# Patient Record
Sex: Female | Born: 1950 | Race: Black or African American | Hispanic: No | Marital: Married | Smoking: Never smoker
Health system: Southern US, Community
[De-identification: ages and names within clinical notes are randomized; demographics above are authoritative.]

## PROBLEM LIST (undated history)

## (undated) DIAGNOSIS — Z972 Presence of dental prosthetic device (complete) (partial): Secondary | ICD-10-CM

## (undated) DIAGNOSIS — K589 Irritable bowel syndrome without diarrhea: Secondary | ICD-10-CM

## (undated) DIAGNOSIS — T4145XA Adverse effect of unspecified anesthetic, initial encounter: Secondary | ICD-10-CM

## (undated) DIAGNOSIS — Z923 Personal history of irradiation: Secondary | ICD-10-CM

## (undated) DIAGNOSIS — M419 Scoliosis, unspecified: Secondary | ICD-10-CM

## (undated) DIAGNOSIS — Z9889 Other specified postprocedural states: Secondary | ICD-10-CM

## (undated) DIAGNOSIS — C50919 Malignant neoplasm of unspecified site of unspecified female breast: Secondary | ICD-10-CM

## (undated) DIAGNOSIS — R112 Nausea with vomiting, unspecified: Secondary | ICD-10-CM

## (undated) DIAGNOSIS — T8859XA Other complications of anesthesia, initial encounter: Secondary | ICD-10-CM

## (undated) DIAGNOSIS — G4730 Sleep apnea, unspecified: Secondary | ICD-10-CM

## (undated) DIAGNOSIS — I10 Essential (primary) hypertension: Secondary | ICD-10-CM

## (undated) HISTORY — DX: Irritable bowel syndrome, unspecified: K58.9

## (undated) HISTORY — PX: CATARACT EXTRACTION: SUR2

## (undated) HISTORY — PX: EYE SURGERY: SHX253

## (undated) HISTORY — DX: Essential (primary) hypertension: I10

## (undated) HISTORY — PX: YAG LASER APPLICATION: SHX6189

## (undated) HISTORY — DX: Personal history of irradiation: Z92.3

## (undated) HISTORY — DX: Sleep apnea, unspecified: G47.30

## (undated) HISTORY — PX: COLONOSCOPY: SHX174

## (undated) HISTORY — PX: VULVA /PERINEUM BIOPSY: SHX319

## (undated) HISTORY — PX: HEMORRHOID SURGERY: SHX153

---

## 1989-02-04 HISTORY — PX: CARPAL TUNNEL RELEASE: SHX101

## 1993-02-04 HISTORY — PX: VAGINAL HYSTERECTOMY: SUR661

## 1997-06-17 ENCOUNTER — Other Ambulatory Visit: Admission: RE | Admit: 1997-06-17 | Discharge: 1997-06-17 | Payer: Self-pay | Admitting: Obstetrics and Gynecology

## 1999-01-03 ENCOUNTER — Other Ambulatory Visit: Admission: RE | Admit: 1999-01-03 | Discharge: 1999-01-03 | Payer: Self-pay | Admitting: Obstetrics and Gynecology

## 2000-01-03 ENCOUNTER — Other Ambulatory Visit: Admission: RE | Admit: 2000-01-03 | Discharge: 2000-01-03 | Payer: Self-pay | Admitting: Obstetrics and Gynecology

## 2001-01-19 ENCOUNTER — Other Ambulatory Visit: Admission: RE | Admit: 2001-01-19 | Discharge: 2001-01-19 | Payer: Self-pay | Admitting: Obstetrics and Gynecology

## 2002-01-20 ENCOUNTER — Other Ambulatory Visit: Admission: RE | Admit: 2002-01-20 | Discharge: 2002-01-20 | Payer: Self-pay | Admitting: Obstetrics and Gynecology

## 2002-12-09 ENCOUNTER — Other Ambulatory Visit: Admission: RE | Admit: 2002-12-09 | Discharge: 2002-12-09 | Payer: Self-pay | Admitting: Obstetrics and Gynecology

## 2003-08-18 ENCOUNTER — Encounter: Admission: RE | Admit: 2003-08-18 | Discharge: 2003-08-18 | Payer: Self-pay | Admitting: Family Medicine

## 2003-10-11 ENCOUNTER — Encounter (INDEPENDENT_AMBULATORY_CARE_PROVIDER_SITE_OTHER): Payer: Self-pay | Admitting: *Deleted

## 2003-10-11 ENCOUNTER — Ambulatory Visit (HOSPITAL_COMMUNITY): Admission: RE | Admit: 2003-10-11 | Discharge: 2003-10-11 | Payer: Self-pay | Admitting: Internal Medicine

## 2003-10-11 HISTORY — PX: ESOPHAGOGASTRODUODENOSCOPY: SHX1529

## 2003-12-07 ENCOUNTER — Ambulatory Visit: Payer: Self-pay | Admitting: Internal Medicine

## 2003-12-15 ENCOUNTER — Other Ambulatory Visit: Admission: RE | Admit: 2003-12-15 | Discharge: 2003-12-15 | Payer: Self-pay | Admitting: Obstetrics and Gynecology

## 2003-12-19 ENCOUNTER — Encounter: Admission: RE | Admit: 2003-12-19 | Discharge: 2003-12-19 | Payer: Self-pay | Admitting: Internal Medicine

## 2004-02-01 ENCOUNTER — Ambulatory Visit: Payer: Self-pay | Admitting: Internal Medicine

## 2004-02-02 ENCOUNTER — Ambulatory Visit: Payer: Self-pay | Admitting: Internal Medicine

## 2004-05-14 ENCOUNTER — Encounter (INDEPENDENT_AMBULATORY_CARE_PROVIDER_SITE_OTHER): Payer: Self-pay | Admitting: *Deleted

## 2004-05-14 LAB — CONVERTED CEMR LAB

## 2004-11-02 ENCOUNTER — Ambulatory Visit: Payer: Self-pay | Admitting: Internal Medicine

## 2004-11-30 ENCOUNTER — Ambulatory Visit: Payer: Self-pay | Admitting: Internal Medicine

## 2004-12-04 ENCOUNTER — Ambulatory Visit: Payer: Self-pay | Admitting: Internal Medicine

## 2004-12-17 ENCOUNTER — Ambulatory Visit: Payer: Self-pay | Admitting: Internal Medicine

## 2005-01-14 ENCOUNTER — Other Ambulatory Visit: Admission: RE | Admit: 2005-01-14 | Discharge: 2005-01-14 | Payer: Self-pay | Admitting: Obstetrics and Gynecology

## 2005-06-20 ENCOUNTER — Ambulatory Visit: Payer: Self-pay | Admitting: Family Medicine

## 2005-07-30 ENCOUNTER — Ambulatory Visit: Payer: Self-pay | Admitting: Internal Medicine

## 2005-12-30 ENCOUNTER — Ambulatory Visit: Payer: Self-pay | Admitting: Internal Medicine

## 2005-12-30 LAB — CONVERTED CEMR LAB
ALT: 31 units/L (ref 0–40)
AST: 27 units/L (ref 0–37)
BUN: 17 mg/dL (ref 6–23)
Chol/HDL Ratio, serum: 2.7
Cholesterol: 140 mg/dL (ref 0–200)
Creatinine, Ser: 0.9 mg/dL (ref 0.4–1.2)
Creatinine,U: 54.2 mg/dL
HDL: 51.9 mg/dL (ref >39.0)
Hgb A1c MFr Bld: 5 % (ref 4.6–6.0)
LDL Cholesterol: 77 mg/dL (ref 0–99)
Microalb, Ur: 0.2 mg/dL (ref 0.0–1.9)
Potassium: 3.6 meq/L (ref 3.5–5.1)
Triglyceride fasting, serum: 58 mg/dL (ref 0–149)
VLDL: 12 mg/dL (ref 0–40)

## 2006-03-22 IMAGING — US US ABDOMEN COMPLETE
1 series · 14 of 25 positions shown · non-contrast
Comparison: none

CLINICAL DATA: Abdominal pain.
 ULTRASOUND OF THE ABDOMEN COMPLETE
 Scans over the upper abdomen were performed.  The gallbladder is well seen and no gallstones are noted.  The liver has a normal echogenic pattern.  No ductal dilatation is seen.  The common bile duct is normal measuring 3.1 mm in diameter.  Evaluation of the IVC is limited by bowel gas.  The pancreas is somewhat obscured by bowel gas as well particularly in the tail.  No mass or ductal dilatation is seen.  The spleen is normal in size.  No hydronephrosis is noted.  The right kidney measures 10.0 cm sagittally with the left kidney measuring 10.5 cm.  The abdominal aorta is normal in caliber. 
 IMPRESSION
 1.  No gallstones.  
 2.  Bowel gas obscures portions of the anatomy particularly the pancreas.

[Series 1: unknown · 14 of 75 slices shown]
[im 1/75]
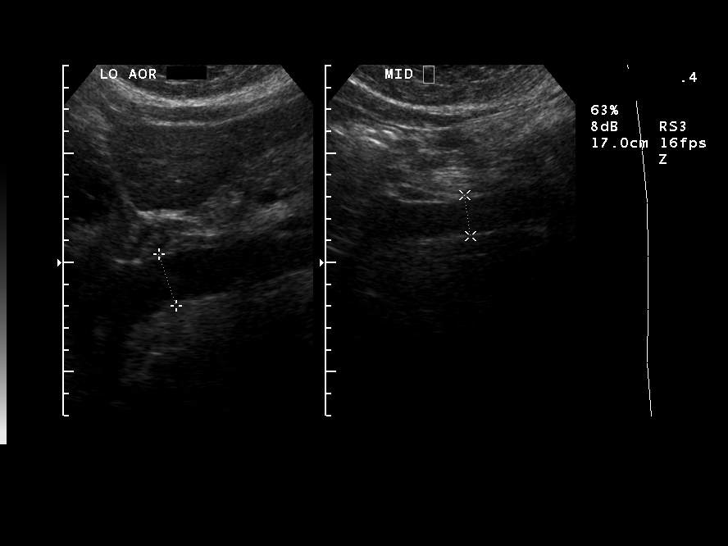
[im 7/75]
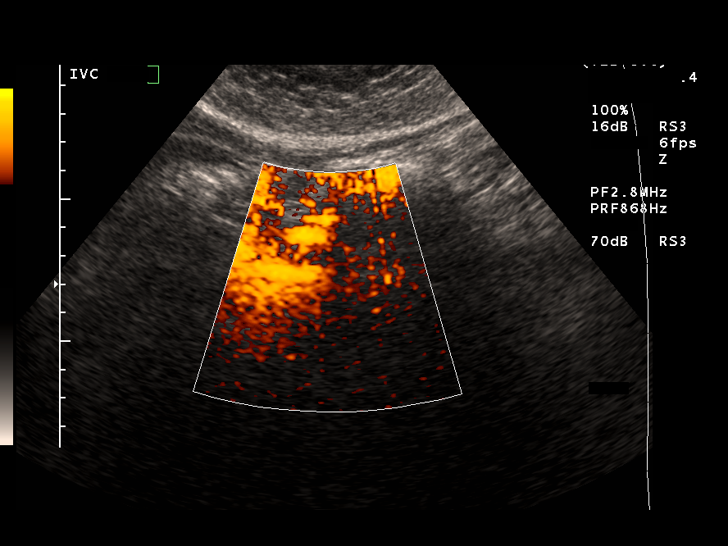
[im 13/75]
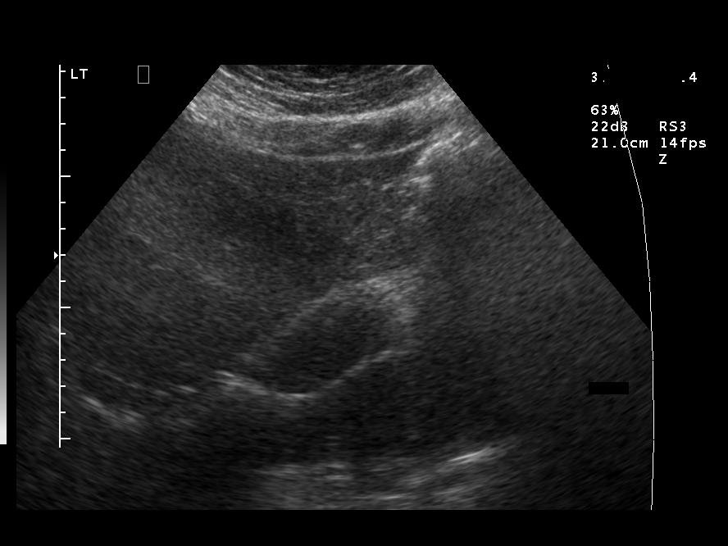
[im 19/75]
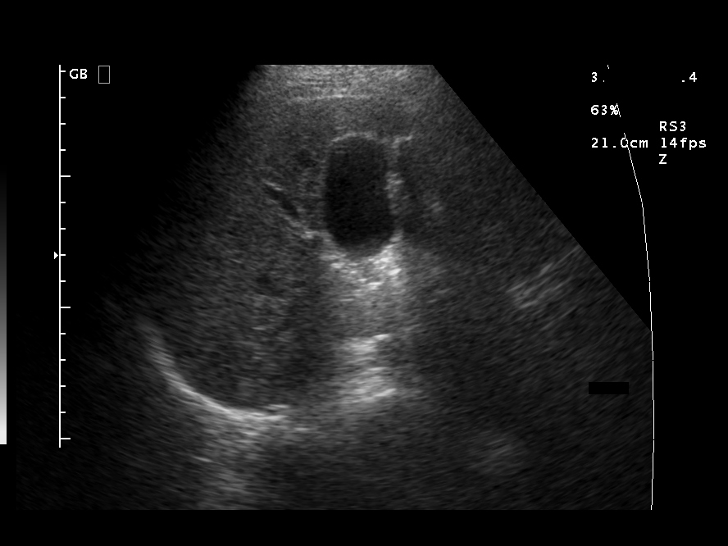
[im 25/75]
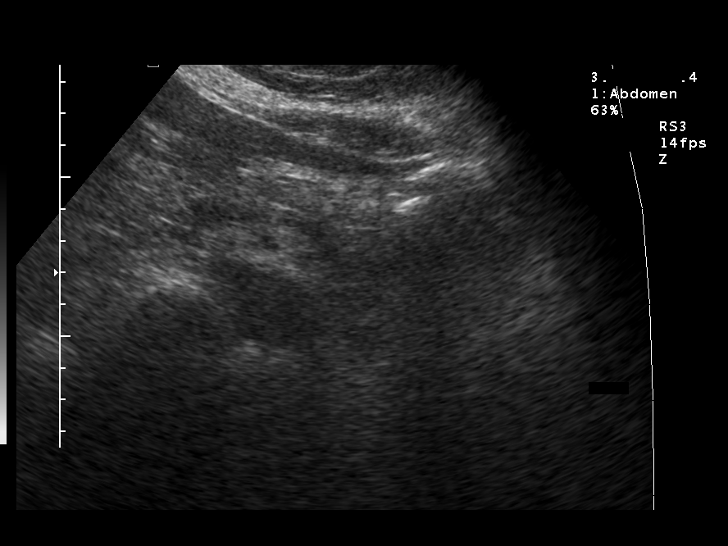
[im 28/75]
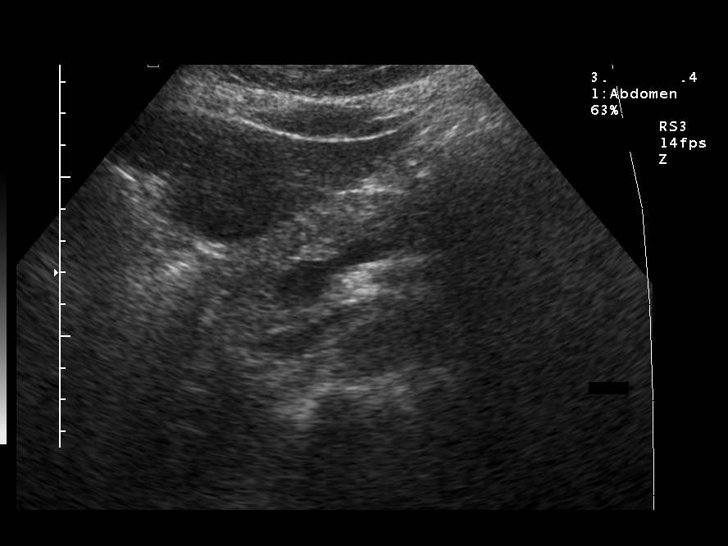
[im 34/75]
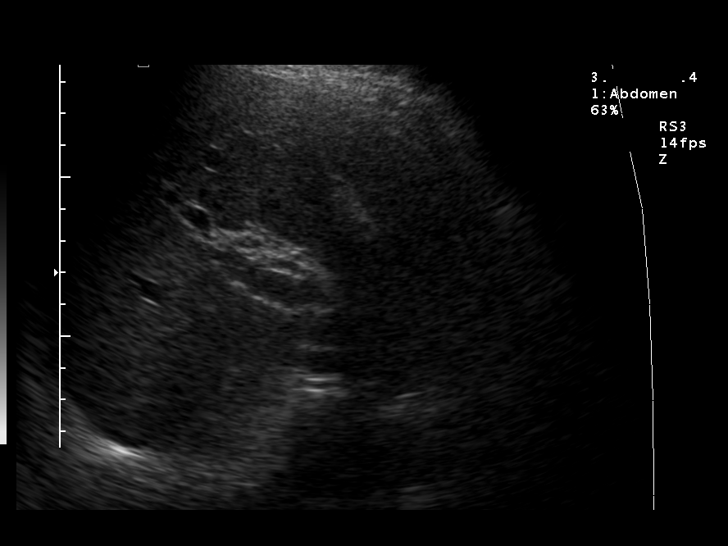
[im 41/75]
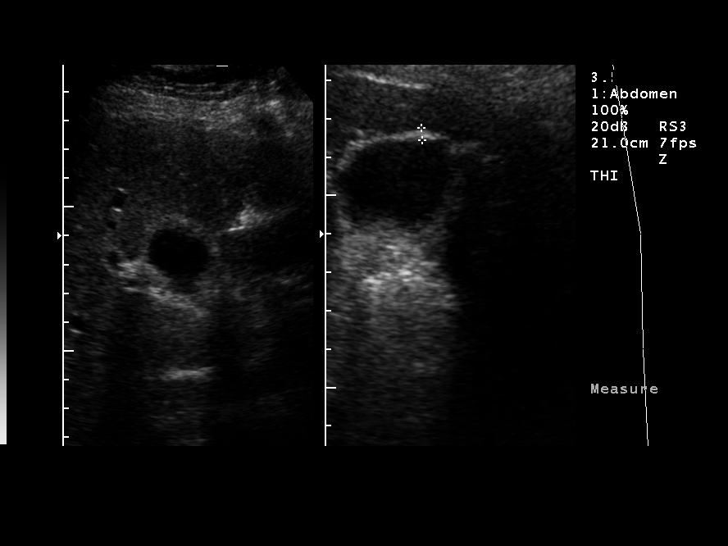
[im 47/75]
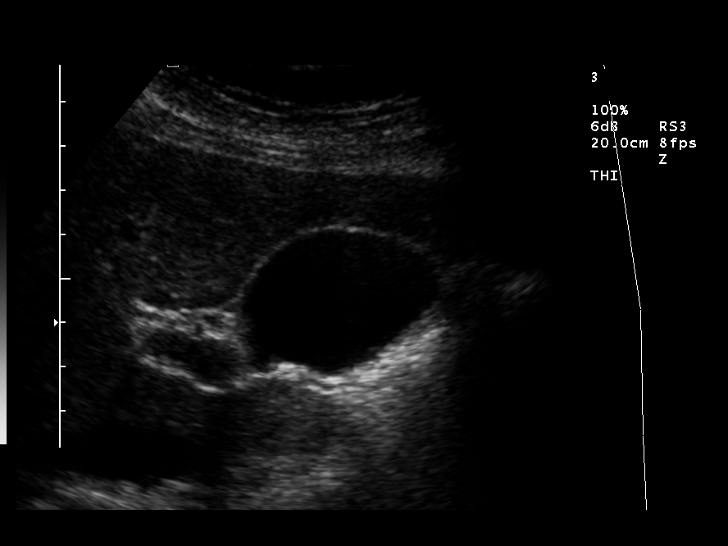
[im 50/75]
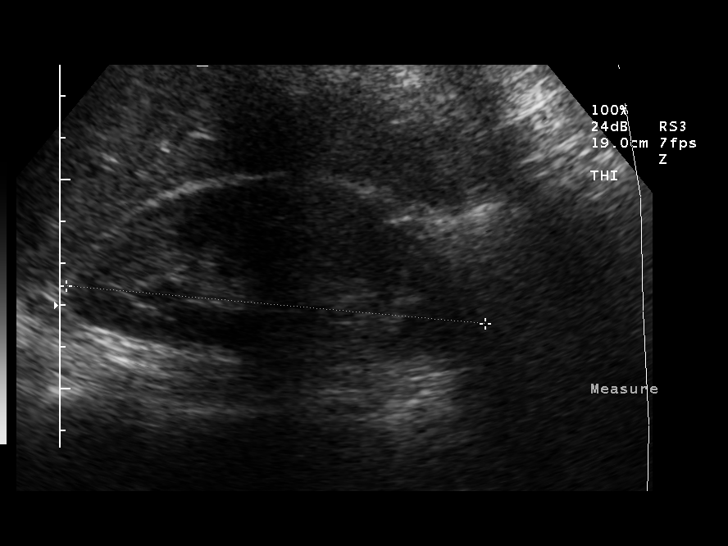
[im 56/75]
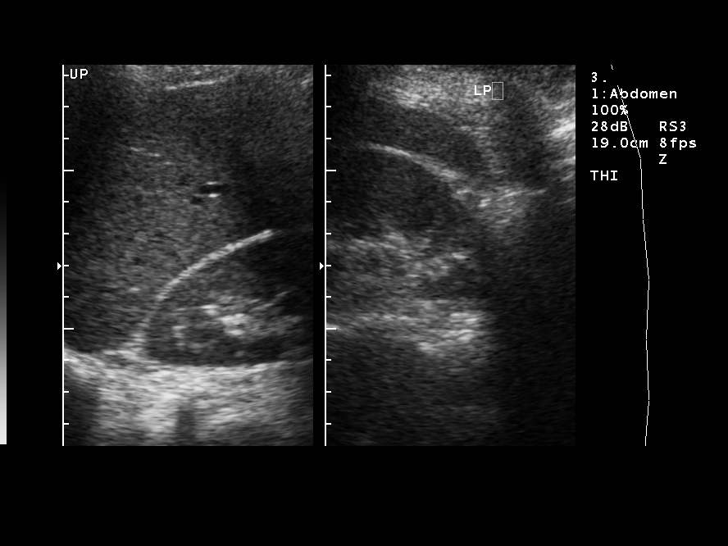
[im 62/75]
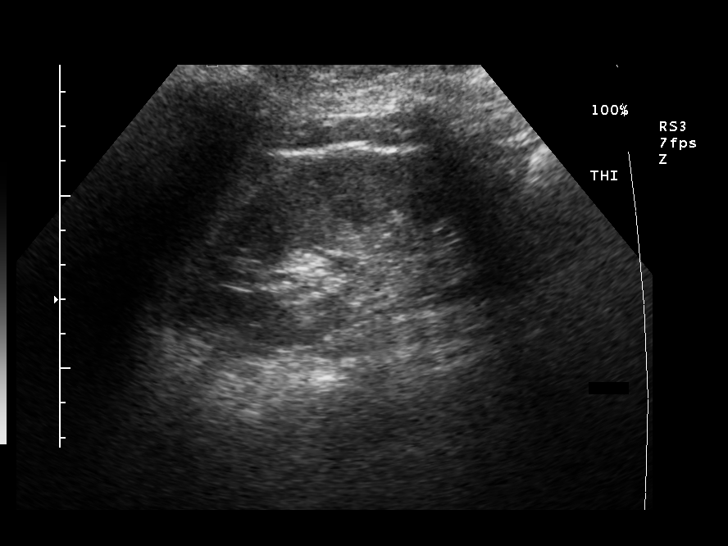
[im 68/75]
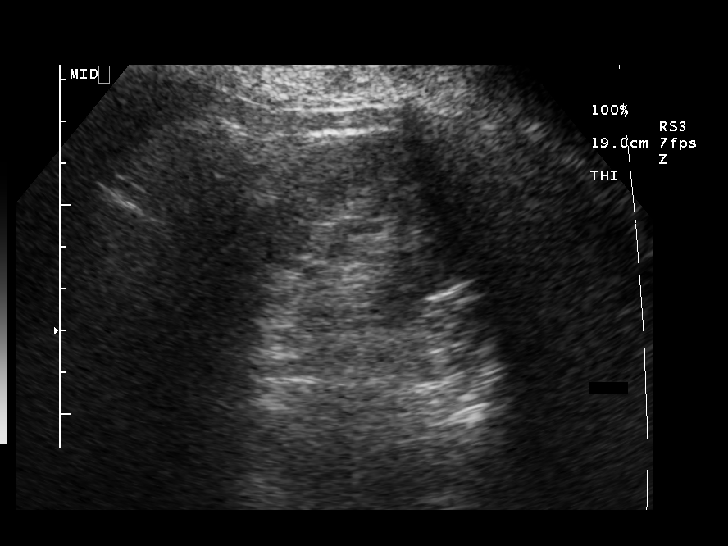
[im 75/75]
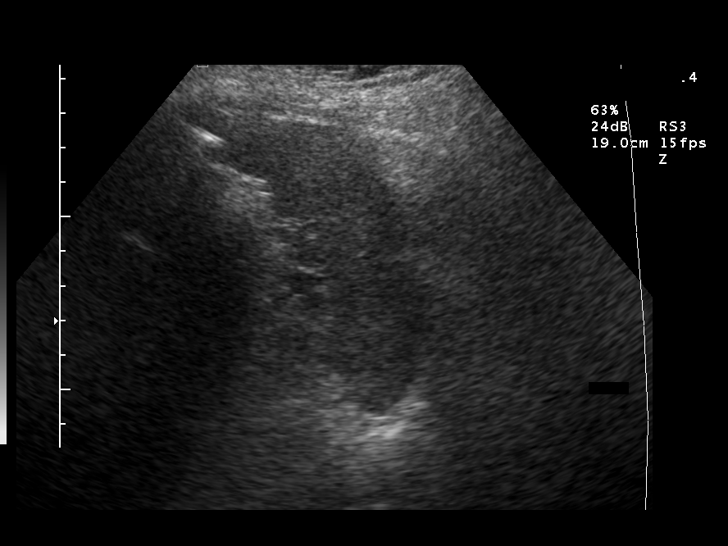

[14 of 25 positions shown; findings below may reference images not displayed]

## 2006-06-04 ENCOUNTER — Ambulatory Visit: Payer: Self-pay | Admitting: Internal Medicine

## 2006-06-23 ENCOUNTER — Encounter: Payer: Self-pay | Admitting: Internal Medicine

## 2006-09-02 ENCOUNTER — Encounter: Payer: Self-pay | Admitting: Family Medicine

## 2007-03-30 ENCOUNTER — Encounter (INDEPENDENT_AMBULATORY_CARE_PROVIDER_SITE_OTHER): Payer: Self-pay | Admitting: *Deleted

## 2007-03-30 DIAGNOSIS — I10 Essential (primary) hypertension: Secondary | ICD-10-CM | POA: Insufficient documentation

## 2007-03-30 DIAGNOSIS — G4730 Sleep apnea, unspecified: Secondary | ICD-10-CM | POA: Insufficient documentation

## 2007-03-30 DIAGNOSIS — R7611 Nonspecific reaction to tuberculin skin test without active tuberculosis: Secondary | ICD-10-CM | POA: Insufficient documentation

## 2007-03-30 DIAGNOSIS — Z91018 Allergy to other foods: Secondary | ICD-10-CM | POA: Insufficient documentation

## 2007-03-30 DIAGNOSIS — D573 Sickle-cell trait: Secondary | ICD-10-CM | POA: Insufficient documentation

## 2007-03-30 DIAGNOSIS — R9431 Abnormal electrocardiogram [ECG] [EKG]: Secondary | ICD-10-CM | POA: Insufficient documentation

## 2007-03-30 DIAGNOSIS — K589 Irritable bowel syndrome without diarrhea: Secondary | ICD-10-CM | POA: Insufficient documentation

## 2007-03-30 DIAGNOSIS — K219 Gastro-esophageal reflux disease without esophagitis: Secondary | ICD-10-CM | POA: Insufficient documentation

## 2007-05-18 ENCOUNTER — Telehealth (INDEPENDENT_AMBULATORY_CARE_PROVIDER_SITE_OTHER): Payer: Self-pay | Admitting: *Deleted

## 2007-06-11 ENCOUNTER — Encounter (INDEPENDENT_AMBULATORY_CARE_PROVIDER_SITE_OTHER): Payer: Self-pay | Admitting: *Deleted

## 2007-06-11 ENCOUNTER — Encounter: Payer: Self-pay | Admitting: Internal Medicine

## 2007-06-25 ENCOUNTER — Encounter: Payer: Self-pay | Admitting: Internal Medicine

## 2007-07-20 ENCOUNTER — Encounter: Payer: Self-pay | Admitting: Internal Medicine

## 2007-11-02 ENCOUNTER — Ambulatory Visit: Payer: Self-pay | Admitting: Internal Medicine

## 2007-11-02 DIAGNOSIS — E785 Hyperlipidemia, unspecified: Secondary | ICD-10-CM | POA: Insufficient documentation

## 2007-11-02 DIAGNOSIS — D649 Anemia, unspecified: Secondary | ICD-10-CM | POA: Insufficient documentation

## 2007-11-03 ENCOUNTER — Encounter (INDEPENDENT_AMBULATORY_CARE_PROVIDER_SITE_OTHER): Payer: Self-pay | Admitting: *Deleted

## 2007-11-11 ENCOUNTER — Ambulatory Visit: Payer: Self-pay | Admitting: Internal Medicine

## 2007-11-11 LAB — CONVERTED CEMR LAB
OCCULT 1: NEGATIVE
OCCULT 2: NEGATIVE
OCCULT 3: NEGATIVE

## 2007-11-12 ENCOUNTER — Encounter (INDEPENDENT_AMBULATORY_CARE_PROVIDER_SITE_OTHER): Payer: Self-pay | Admitting: *Deleted

## 2008-06-20 ENCOUNTER — Encounter: Payer: Self-pay | Admitting: Internal Medicine

## 2009-04-10 ENCOUNTER — Ambulatory Visit: Payer: Self-pay | Admitting: Internal Medicine

## 2009-07-31 ENCOUNTER — Encounter: Payer: Self-pay | Admitting: Internal Medicine

## 2010-03-04 LAB — CONVERTED CEMR LAB
ALT: 16 units/L (ref 0–35)
ALT: 18 units/L (ref 0–35)
AST: 20 units/L (ref 0–37)
AST: 21 units/L (ref 0–37)
Albumin: 3.9 g/dL (ref 3.5–5.2)
Albumin: 4 g/dL (ref 3.5–5.2)
Alkaline Phosphatase: 76 units/L (ref 39–117)
Alkaline Phosphatase: 84 units/L (ref 39–117)
BUN: 12 mg/dL (ref 6–23)
BUN: 8 mg/dL (ref 6–23)
Basophils Absolute: 0 10*3/uL (ref 0.0–0.1)
Basophils Absolute: 0 10*3/uL (ref 0.0–0.1)
Basophils Relative: 0.6 % (ref 0.0–3.0)
Basophils Relative: 0.6 % (ref 0.0–3.0)
Bilirubin Urine: NEGATIVE
Bilirubin, Direct: 0.1 mg/dL (ref 0.0–0.3)
Bilirubin, Direct: 0.1 mg/dL (ref 0.0–0.3)
Blood in Urine, dipstick: NEGATIVE
CO2: 31 meq/L (ref 19–32)
CO2: 32 meq/L (ref 19–32)
Calcium: 9.3 mg/dL (ref 8.4–10.5)
Calcium: 9.4 mg/dL (ref 8.4–10.5)
Chloride: 108 meq/L (ref 96–112)
Chloride: 110 meq/L (ref 96–112)
Cholesterol: 124 mg/dL (ref 0–200)
Cholesterol: 139 mg/dL (ref 0–200)
Creatinine, Ser: 0.9 mg/dL (ref 0.4–1.2)
Creatinine, Ser: 1 mg/dL (ref 0.4–1.2)
Eosinophils Absolute: 0 10*3/uL (ref 0.0–0.7)
Eosinophils Absolute: 0.1 10*3/uL (ref 0.0–0.7)
Eosinophils Relative: 0.9 % (ref 0.0–5.0)
Eosinophils Relative: 1.6 % (ref 0.0–5.0)
Folate: 17.3 ng/mL
GFR calc Af Amer: 83 mL/min
GFR calc non Af Amer: 69 mL/min
GFR calc non Af Amer: 73.17 mL/min (ref >60)
Glucose, Bld: 81 mg/dL (ref 70–99)
Glucose, Bld: 87 mg/dL (ref 70–99)
Glucose, Urine, Semiquant: NEGATIVE
HCT: 39.3 % (ref 36.0–46.0)
HCT: 40.6 % (ref 36.0–46.0)
HDL: 52.1 mg/dL (ref >39.00)
HDL: 53.1 mg/dL (ref >39.0)
Hemoglobin: 12.7 g/dL (ref 12.0–15.0)
Hemoglobin: 13.4 g/dL (ref 12.0–15.0)
Iron: 71 ug/dL (ref 42–145)
Ketones, urine, test strip: NEGATIVE
LDL Cholesterol: 51 mg/dL (ref 0–99)
LDL Cholesterol: 71 mg/dL (ref 0–99)
Lymphocytes Relative: 43.2 % (ref 12.0–46.0)
Lymphocytes Relative: 46 % (ref 12.0–46.0)
Lymphs Abs: 1.6 10*3/uL (ref 0.7–4.0)
MCHC: 32.2 g/dL (ref 30.0–36.0)
MCHC: 33.1 g/dL (ref 30.0–36.0)
MCV: 90.2 fL (ref 78.0–100.0)
MCV: 91 fL (ref 78.0–100.0)
Monocytes Absolute: 0.3 10*3/uL (ref 0.1–1.0)
Monocytes Absolute: 0.3 10*3/uL (ref 0.1–1.0)
Monocytes Relative: 7.3 % (ref 3.0–12.0)
Monocytes Relative: 8.4 % (ref 3.0–12.0)
Neutro Abs: 1.6 10*3/uL (ref 1.4–7.7)
Neutro Abs: 1.7 10*3/uL (ref 1.4–7.7)
Neutrophils Relative %: 45.2 % (ref 43.0–77.0)
Neutrophils Relative %: 46.2 % (ref 43.0–77.0)
Nitrite: NEGATIVE
Platelets: 186 10*3/uL (ref 150.0–400.0)
Platelets: 189 10*3/uL (ref 150–400)
Potassium: 3.5 meq/L (ref 3.5–5.1)
Potassium: 3.7 meq/L (ref 3.5–5.1)
Protein, U semiquant: NEGATIVE
RBC: 4.32 M/uL (ref 3.87–5.11)
RBC: 4.5 M/uL (ref 3.87–5.11)
RDW: 12.9 % (ref 11.5–14.6)
RDW: 13.1 % (ref 11.5–14.6)
Saturation Ratios: 22.1 % (ref 20.0–50.0)
Sodium: 142 meq/L (ref 135–145)
Sodium: 143 meq/L (ref 135–145)
Specific Gravity, Urine: <1.005
TSH: 0.96 microintl units/mL (ref 0.35–5.50)
TSH: 1.35 microintl units/mL (ref 0.35–5.50)
Total Bilirubin: 0.5 mg/dL (ref 0.3–1.2)
Total Bilirubin: 0.8 mg/dL (ref 0.3–1.2)
Total CHOL/HDL Ratio: 2
Total CHOL/HDL Ratio: 2.6
Total Protein: 7.6 g/dL (ref 6.0–8.3)
Total Protein: 7.7 g/dL (ref 6.0–8.3)
Transferrin: 229.8 mg/dL (ref >=212.0)
Triglycerides: 107 mg/dL (ref 0.0–149.0)
Triglycerides: 74 mg/dL (ref 0–149)
Urobilinogen, UA: NEGATIVE
VLDL: 15 mg/dL (ref 0–40)
VLDL: 21.4 mg/dL (ref 0.0–40.0)
Vitamin B-12: 744 pg/mL (ref 211–911)
WBC Urine, dipstick: NEGATIVE
WBC: 3.6 10*3/uL — ABNORMAL LOW (ref 4.5–10.5)
WBC: 3.8 10*3/uL — ABNORMAL LOW (ref 4.5–10.5)
pH: 6.5

## 2010-03-08 NOTE — Assessment & Plan Note (Signed)
Summary: cpx/ns/kdc   Vital Signs:  Patient profile:   60 year old female Height:      61.75 inches Weight:      176.8 pounds BMI:     32.72 Temp:     98.7 degrees F oral Pulse rate:   70 / minute Resp:     16 per minute BP sitting:   118 / 86  (left arm) Cuff size:   large  Vitals Entered By: Shonna Chock (April 10, 2009 8:35 AM)  Comments REVIEWED MED LIST, PATIENT AGREED DOSE AND INSTRUCTION CORRECT    History of Present Illness: Cynthia Wagner  is here for a physical; she is asymptomatic except for intermittent IBS symptoms.  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 (11-29-02) for atypical headaches; TMJ Dysfunction resolved with Prosthesis; EGD - reflux (10-11-03) GB U/S - NL. (08-18-03) Biliary scan- neg. (07-25-03) PAD screening - neg. (11-24-03) CT abdomen/pelvis - nl. (12-19-03) Participation in IBS [place] Study, PMH of Vulvar biopsy X 2: Lichen Simplex;Verruca, Dr Vincente Poli Hemorrhoidectomy by Infrared Coagulation  Family History: Father: MI , massive died @ 52,smoker Mother: CVA @ 9 (died of complications, in [place] 20 yrs.) Siblings: sister- Breast CA (died 99), PTE                brother- HTN crisis Maternal uncle:  TB Maternal aunts:  DM  Social History: Never Smoked Alcohol use-yes: socially Occupation: Garment/textile technologist Regular exercise-yes: Glider 30 min 3X/week Married  Review of Systems  The patient denies anorexia, fever, weight loss, weight gain,  vision loss, decreased hearing, hoarseness, chest pain, syncope, dyspnea on exertion, peripheral edema, prolonged cough, headaches, hemoptysis, hematuria, incontinence, suspicious skin lesions, depression, unusual weight change, abnormal bleeding, enlarged lymph nodes, and angioedema.         BMD normal 2010 ; mammograms done annually. Dr Vincente Poli seen every 3 mos for HRT management;now ? peri menopausal GI:  Complains of constipation; denies abdominal pain, bloody stools, dark tarry stools, diarrhea, excessive appetite, gas, indigestion, loss of appetite, nausea, and vomiting; Working in OR affects bathroom access.  Physical Exam  General:  well-nourished; alert,appropriate and cooperative throughout examination Head:  Normocephalic and atraumatic without obvious abnormalities.  Eyes:  No corneal or conjunctival inflammation noted. Marland Kitchen Perrla. Funduscopic exam benign, without hemorrhages, exudates or papilledema. Minimal arteriolar narrowing Ears:  External ear exam shows no significant lesions or deformities.  Otoscopic examination reveals clear canals, tympanic membranes are intact bilaterally without bulging, retraction, inflammation or discharge. Hearing is grossly normal bilaterally. Nose:  External nasal examination shows no deformity or inflammation. Nasal mucosa are pink and moist without lesions or exudates. Mouth:  Oral mucosa and oropharynx without lesions or exudates.  Teeth in good repair. Neck:  No deformities, masses, or tenderness noted. Lungs:  Normal respiratory effort, chest expands symmetrically. Lungs are clear to auscultation, no crackles or wheezes. Heart:  Normal rate and regular rhythm. S1 and S2 normal without gallop, murmur, click, rub. S4 Abdomen:  Bowel sounds positive,abdomen soft and non-tender without masses, organomegaly or  hernias noted. Genitalia:  Dr Vincente Poli Msk:  Asymmetry of posterior thoracic musculature Pulses:  R and L carotid,radial,dorsalis pedis and  posterior tibial pulses are full and equal bilaterally Extremities:  No clubbing, cyanosis, edema, or deformity noted with normal full range of motion of all joints.  Pes planus. Mild crepitus of knees  Neurologic:  alert & oriented X3 and DTRs symmetrical and normal.   Skin:  Intact without suspicious lesions or rashes Cervical Nodes:  No lymphadenopathy noted Axillary Nodes:  No palpable lymphadenopathy Psych:  memory intact for recent and remote, normally interactive, and good eye contact.     Impression & Recommendations:  Problem # 1:  ROUTINE GENERAL MEDICAL EXAM@[email or web address]  CARE FACL (ICD-V70.0)  Orders: EKG w/ Interpretation (93000) Venipuncture (21308) TLB-Lipid Panel (80061-LIPID) TLB-BMP (Basic Metabolic Panel-BMET) (80048-METABOL) TLB-CBC Platelet - w/Differential (85025-CBCD) TLB-Hepatic/Liver Function Pnl (80076-HEPATIC) TLB-TSH (Thyroid Stimulating Hormone) (84443-TSH)  Problem # 2:  HYPERLIPIDEMIA (ICD-272.4)  Her updated medication list for this problem includes:    Vytorin 10-20 Mg Tabs (Ezetimibe-simvastatin) .Marland Kitchen... Take one tablet at bedtime  Orders: Venipuncture (65784) TLB-Lipid Panel (80061-LIPID)  Problem # 3:  Hx of ABNORMAL ELECTROCARDIOGRAM (ICD-794.31) Stable NS ST-T changes  Problem # 4:  SLEEP APNEA (ICD-780.57) CPAP controlled  Problem # 5:  IBS (ICD-564.1)  Problem # 6:  HYPERTENSION (ICD-401.9)  Her updated medication list for this problem includes:    Diovan Hct 80-12.5 Mg Tabs (Valsartan-hydrochlorothiazide) .Marland Kitchen... Take as directed    Metoprolol Tartrate 50 Mg Tabs (Metoprolol tartrate) .Marland Kitchen... As directed  Orders: EKG w/ Interpretation (93000) Venipuncture (69629)  Complete Medication List: 1)  Diovan Hct 80-12.5 Mg Tabs (Valsartan-hydrochlorothiazide) .... Take as directed 2)  Vytorin 10-20 Mg Tabs (Ezetimibe-simvastatin) .... Take one tablet at bedtime 3)  Prilosec Otc 20 Mg Tbec (Omeprazole magnesium) .... As needed 4)  Metoprolol  Tartrate 50 Mg Tabs (Metoprolol tartrate) .... As directed 5)  Multivitamins Tabs (Multiple vitamin) .... Take 1 tablet by mouth once a day 6)  Fish Oil Concentrate 1000 Mg Caps (Omega-3 fatty acids) 7)  Colace 100 Mg Caps (Docusate sodium) .... As needed 8)  Restasis 0.05 % Emul (Cyclosporine) .... Two times a day 9)  Cpap  10)  Femring 0.1 Mg/24hr Ring (Estradiol acetate) .... As directed 11)  Align Caps (Probiotic product) .Marland Kitchen.. 1 by mouth once daily as needed 12)  Multivitamins Tabs (Multiple vitamin) .Marland Kitchen.. 1 by mouth once daily  Patient Instructions: 1)  Check your Blood Pressure regularly. If it is above:135/85 ON AVERAGE  you should make an appointment. Your EKG changes are NON SPECIFIC & STABLE

## 2010-04-23 ENCOUNTER — Encounter: Payer: Self-pay | Admitting: Internal Medicine

## 2010-04-23 ENCOUNTER — Encounter (INDEPENDENT_AMBULATORY_CARE_PROVIDER_SITE_OTHER): Payer: BC Managed Care – PPO | Admitting: Internal Medicine

## 2010-04-23 ENCOUNTER — Other Ambulatory Visit: Payer: Self-pay | Admitting: Internal Medicine

## 2010-04-23 DIAGNOSIS — E559 Vitamin D deficiency, unspecified: Secondary | ICD-10-CM

## 2010-04-23 DIAGNOSIS — Z0000 Encounter for general adult medical examination without abnormal findings: Secondary | ICD-10-CM

## 2010-04-23 DIAGNOSIS — Z8249 Family history of ischemic heart disease and other diseases of the circulatory system: Secondary | ICD-10-CM

## 2010-04-23 DIAGNOSIS — I10 Essential (primary) hypertension: Secondary | ICD-10-CM

## 2010-04-23 DIAGNOSIS — E785 Hyperlipidemia, unspecified: Secondary | ICD-10-CM

## 2010-04-23 DIAGNOSIS — R9431 Abnormal electrocardiogram [ECG] [EKG]: Secondary | ICD-10-CM

## 2010-04-23 LAB — CBC WITH DIFFERENTIAL/PLATELET
Basophils Absolute: 0 10*3/uL (ref 0.0–0.1)
Basophils Relative: 0.5 % (ref 0.0–3.0)
Eosinophils Absolute: 0.1 10*3/uL (ref 0.0–0.7)
Eosinophils Relative: 3.3 % (ref 0.0–5.0)
HCT: 37.9 % (ref 36.0–46.0)
Hemoglobin: 12.7 g/dL (ref 12.0–15.0)
Lymphocytes Relative: 30.8 % (ref 12.0–46.0)
Lymphs Abs: 1.3 10*3/uL (ref 0.7–4.0)
MCHC: 33.6 g/dL (ref 30.0–36.0)
MCV: 88.2 fl (ref 78.0–100.0)
Monocytes Absolute: 0.3 10*3/uL (ref 0.1–1.0)
Monocytes Relative: 7.4 % (ref 3.0–12.0)
Neutro Abs: 2.4 10*3/uL (ref 1.4–7.7)
Neutrophils Relative %: 58 % (ref 43.0–77.0)
Platelets: 187 10*3/uL (ref 150.0–400.0)
RBC: 4.3 Mil/uL (ref 3.87–5.11)
RDW: 14.1 % (ref 11.5–14.6)
WBC: 4.2 10*3/uL — ABNORMAL LOW (ref 4.5–10.5)

## 2010-04-23 LAB — CONVERTED CEMR LAB
Cholesterol, target level: 200 mg/dL
HDL goal, serum: 40 mg/dL
LDL Goal: 130 mg/dL

## 2010-04-23 LAB — HEPATIC FUNCTION PANEL
ALT: 19 U/L (ref 0–35)
AST: 20 U/L (ref 0–37)
Albumin: 4 g/dL (ref 3.5–5.2)
Alkaline Phosphatase: 86 U/L (ref 39–117)
Bilirubin, Direct: 0.1 mg/dL (ref 0.0–0.3)
Total Bilirubin: 0.9 mg/dL (ref 0.3–1.2)
Total Protein: 6.5 g/dL (ref 6.0–8.3)

## 2010-04-23 LAB — BASIC METABOLIC PANEL
BUN: 15 mg/dL (ref 6–23)
CO2: 28 mEq/L (ref 19–32)
Calcium: 9.3 mg/dL (ref 8.4–10.5)
Chloride: 103 mEq/L (ref 96–112)
Creatinine, Ser: 0.8 mg/dL (ref 0.4–1.2)
GFR: 91.68 mL/min (ref >60.00)
Glucose, Bld: 93 mg/dL (ref 70–99)
Potassium: 3.8 mEq/L (ref 3.5–5.1)
Sodium: 139 mEq/L (ref 135–145)

## 2010-04-23 LAB — TSH: TSH: 1.33 u[IU]/mL (ref 0.35–5.50)

## 2010-04-24 LAB — CONVERTED CEMR LAB: Vit D, 25-Hydroxy: 50 ng/mL (ref 30–89)

## 2010-04-27 ENCOUNTER — Encounter: Payer: Self-pay | Admitting: Internal Medicine

## 2010-04-30 ENCOUNTER — Encounter: Payer: Self-pay | Admitting: Family Medicine

## 2010-05-01 ENCOUNTER — Encounter: Payer: Self-pay | Admitting: Family Medicine

## 2010-05-03 NOTE — Assessment & Plan Note (Signed)
Summary: cpx//cbs/ph   Vital Signs:  Patient profile:   60 year old female Height:      61.75 inches Weight:      174.8 pounds BMI:     32.35 Temp:     98.2 degrees F oral Pulse rate:   64 / minute Resp:     14 per minute BP sitting:   118 / 80  (left arm) Cuff size:   large  Vitals Entered By: Shonna Chock CMA (April 23, 2010 8:14 AM) CC: CPX with fasting labs , Lipid Management   CC:  CPX with fasting labs  and Lipid Management.  History of Present Illness:    Cynthia Wagner is here for a physical; she is asymptomatic.    Hyperlipidemia Follow-Up:She reports constipation  (IBS)and fatigue (works 10 hr shifts), but denies muscle aches, GI upset, abdominal pain, flushing, itching,  diarrhea,chest pain/pressure, exercise intolerance, dypsnea, palpitations, syncope, and pedal edema.  Compliance with medications (by patient report) has been near 100%.  Dietary compliance has been good.  The patient reports exercising 3 X per week.  Adjunctive measures currently used by the patient include fiber, flax seed  oil supplements, and Co-Q 10.   Hypertension Follow-Up: She denies lightheadedness, urinary frequency, and headaches.  Compliance with medications (by patient report) has been near 100%.  Adjunctive measures currently used by the patient include salt restriction.  BP averages 128/68.  Lipid Management History:      Positive NCEP/ATP III risk factors include female age 67 years old or older, family history for ischemic heart disease (males less than 28 years old), and hypertension.  Negative NCEP/ATP III risk factors include no history of early menopause without estrogen hormone replacement, non-diabetic, non-tobacco-user status, no ASHD (atherosclerotic heart disease), no prior stroke/TIA, no peripheral vascular disease, and no history of aortic aneurysm.     Current Medications (verified): 1)  Diovan Hct 80-12.5 Mg  Tabs (Valsartan-Hydrochlorothiazide) .... Take As Directed 2)  Vytorin  10-20 Mg  Tabs (Ezetimibe-Simvastatin) .... 1/2 At Bedtime (Ldl Low 50s) 3)  Metoprolol Tartrate 50 Mg  Tabs (Metoprolol Tartrate) .... As Directed 4)  Multivitamins   Tabs (Multiple Vitamin) .... Take 1 Tablet By Mouth Once A Day 5)  Fish Oil Concentrate 1000 Mg  Caps (Omega-3 Fatty Acids) 6)  Colace 100 Mg Caps (Docusate Sodium) .... As Needed 7)  Restasis 0.05 %  Emul (Cyclosporine) .... Two Times A Day 8)  Cpap 9)  Probiotic  Caps (Probiotic Product) .Marland Kitchen.. 1 By Mouth Once Daily 10)  Multivitamins  Tabs (Multiple Vitamin) .Marland Kitchen.. 1 By Mouth Once Daily 11)  Aspirin 81 Mg Tabs (Aspirin) .Marland Kitchen.. 1 By Mouth Once Daily 12)  Vitamin D 1000 Unit Tabs (Cholecalciferol) .Marland Kitchen.. 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 LDL goal= < 100. [place] Study LDL goal = < 130.  Past Surgical History: Hysterectomy (1995) for uterine fibroids, ovaries  were not removed; G2 P2 , 2 C sections Bilateral Carpal Tunnel  surgery (1610) Colonoscopy - negative (2003, 2006) Stress test- false + with abnormal tracings but normal perfusion (08/2002) MRI brain -normal. (11-29-02) for atypical headaches; TMJ Dysfunction resolved with Prosthesis; EGD - reflux (10-11-03) GB  U/S - NL. (08-18-03) Biliary scan- negative (07-25-03) PAD screening - negative (11-24-03) CT abdomen/pelvis - normal (12-19-03) Participation in IBS [place] Study, PMH of Vulvar biopsy X 2: Lichen Simplex;Verruca, Dr Vincente Poli Hemorrhoidectomy by Infrared Coagulation  Family History: Father: MI , massive died @ 52,smoker Mother: CVA @ 23 (died of complications, in [place] 20 yrs.) Siblings: sister- Breast  cancer,died 31 of  PTE                brother- HTN crisis Maternal uncle:  TB Maternal aunts:  DM  Social History: Never Smoked Alcohol use-yes: socially: 2/week Occupation: Garment/textile technologist Regular exercise-yes: Glider /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    Physical Exam  General:  well-nourished;alert,appropriate and cooperative throughout examination Head:  Normocephalic and atraumatic without obvious abnormalities. Eyes:  No corneal or conjunctival inflammation noted. Perrla. Funduscopic exam benign, without hemorrhages, exudates or papilledema.  Ears:  External ear exam shows no significant lesions or deformities.  Otoscopic examination reveals clear canals, tympanic membranes are intact bilaterally without bulging, retraction, inflammation or discharge. Hearing is grossly normal bilaterally. Nose:  External nasal examination shows no deformity or inflammation. Nasal mucosa are pink and moist without lesions or exudates. Mouth:  Oral mucosa and oropharynx without lesions or exudates.  Teeth in good repair. Neck:  No deformities, masses, or tenderness noted. Lungs:  Normal respiratory effort, chest expands symmetrically. Lungs are clear to auscultation, no crackles or wheezes. Heart:  Normal rate and regular rhythm. S1 and S2 normal without gallop, murmur, click, rub.S4 Abdomen:  Bowel sounds positive,abdomen soft and non-tender without masses, organomegaly or hernias noted. Genitalia:  Dr Vincente Poli Msk:  No deformity or scoliosis noted of thoracic or lumbar spine.   Pulses:  R and L carotid,radial,dorsalis pedis and posterior tibial pulses are full and  equal bilaterally Extremities:  No clubbing, cyanosis, edema, or deformity noted with normal full range of motion of all joints.   Pes planus Neurologic:  alert & oriented X3 and DTRs symmetrical and normal.   Skin:  Intact without suspicious lesions or rashes Cervical Nodes:  No lymphadenopathy noted Axillary Nodes:  No palpable lymphadenopathy Psych:  memory intact for recent and remote, normally interactive, and good eye contact.     Impression & Recommendations:  Problem # 1:  ROUTINE GENERAL MEDICAL EXAM@HEALTH  CARE FACL (ICD-V70.0)  Orders: EKG w/ Interpretation (93000) Venipuncture (69629) TLB-BMP (Basic Metabolic Panel-BMET) (80048-METABOL) TLB-CBC Platelet - w/Differential (85025-CBCD) TLB-Hepatic/Liver Function Pnl (80076-HEPATIC) TLB-TSH (Thyroid Stimulating Hormone) (84443-TSH) T- * Misc. Laboratory test [phone]) T-Vitamin D (25-Hydroxy) [phone])  Problem # 2:  HYPERLIPIDEMIA (ICD-272.4)  Her updated medication list for this problem includes:    Vytorin 10-20 Mg Tabs (Ezetimibe-simvastatin) .Marland Kitchen... 1/2 at bedtime (ldl low 50s)  Orders: T- * Misc. Laboratory test [phone])  Problem # 3:  HYPERTENSION (ICD-401.9) controlled Her updated medication list for this problem includes:    Diovan Hct 80-12.5 Mg Tabs (Valsartan-hydrochlorothiazide) .Marland Kitchen... Take as directed    Metoprolol Tartrate 50 Mg Tabs (Metoprolol tartrate) .Marland Kitchen... As directed  Problem # 4:  MYOCARDIAL INFARCTION, PREMATURE, FAMILY HX (ICD-V17.3)  Orders: T- * Misc. Laboratory test [phone])  Problem # 5:  Hx of ABNORMAL ELECTROCARDIOGRAM (ICD-794.31)  stable NS ST-T changes  Orders: T- * Misc. Laboratory test [phone])  Problem # 6:  UNSPECIFIED VITAMIN D DEFICIENCY (ICD-268.9)  PMH of , Dr Vincente Poli  Orders: T-Vitamin D (25-Hydroxy) [phone])  Complete Medication List: 1)  Diovan Hct 80-12.5 Mg Tabs (Valsartan-hydrochlorothiazide) .... Take as directed 2)  Vytorin 10-20 Mg Tabs  (Ezetimibe-simvastatin) .... 1/2 at bedtime (ldl low 50s) 3)  Metoprolol Tartrate 50 Mg Tabs (Metoprolol tartrate) .... As directed 4)  Multivitamins Tabs (Multiple vitamin) .... Take 1 tablet by mouth once a day 5)  Fish Oil Concentrate 1000 Mg Caps (Omega-3 fatty acids) 6)  Colace 100 Mg Caps (Docusate sodium) .... As needed 7)  Restasis 0.05 % Emul (Cyclosporine) .... Two times a day 8)  Cpap  9)  Probiotic Caps (Probiotic product) .Marland Kitchen.. 1 by mouth once daily 10)  Multivitamins Tabs (Multiple vitamin) .Marland Kitchen.. 1 by mouth once daily 11)  Aspirin 81 Mg Tabs (Aspirin) .Marland Kitchen.. 1 by mouth once daily 12)  Vitamin D 1000 Unit Tabs (Cholecalciferol) .Marland Kitchen.. 1 by mouth once daily  Lipid Assessment/Plan:      Based on NCEP/ATP III, the patient's risk factor category is "2 or more risk factors and a calculated 10 year CAD risk of < 20%".  The patient's lipid goals are as follows: Total cholesterol goal is 200; LDL cholesterol goal is 130; HDL cholesterol goal is 40; Triglyceride goal is 150.    Patient Instructions: 1)  Check your Blood Pressure regularly. If it is above: 135/85 on average you should make an appointment. Prescriptions: METOPROLOL TARTRATE 50 MG  TABS (METOPROLOL TARTRATE) as directed  #30 Tablet x 5   Entered and Authorized by:   Marga Melnick MD   Signed by:   Marga Melnick MD on 04/23/2010   Method used:   Print then Give to Patient   RxID:   [id number] VYTORIN 10-20 MG  TABS (EZETIMIBE-SIMVASTATIN) 1/2 at bedtime (LDL low 50s)  #45 x 3   Entered and Authorized by:   Marga Melnick MD   Signed by:   Marga Melnick MD on 04/23/2010   Method used:   Print then Give to Patient   RxID:   [id number] DIOVAN HCT 80-12.5 MG  TABS (VALSARTAN-HYDROCHLOROTHIAZIDE) take as directed  #90 Tablet x 1   Entered and Authorized by:   Marga Melnick MD   Signed by:   Marga Melnick MD on 04/23/2010   Method used:   Print then Give to Patient   RxID:   [id number]    Orders  Added: 1)  Est. Patient 40-64 years [99396] 2)  EKG w/ Interpretation [93000] 3)  Venipuncture [36415] 4)  TLB-BMP (Basic Metabolic Panel-BMET) [80048-METABOL] 5)  TLB-CBC Platelet - w/Differential [85025-CBCD] 6)  TLB-Hepatic/Liver Function Pnl [80076-HEPATIC] 7)  TLB-TSH (Thyroid Stimulating Hormone) [84443-TSH] 8)  T- * Misc. Laboratory test [phone] 9)  T-Vitamin D (25-Hydroxy) [phone]      Appended Document: cpx//cbs/ph

## 2010-05-15 ENCOUNTER — Encounter: Payer: Self-pay | Admitting: Internal Medicine

## 2010-05-28 ENCOUNTER — Ambulatory Visit (INDEPENDENT_AMBULATORY_CARE_PROVIDER_SITE_OTHER): Payer: BC Managed Care – PPO | Admitting: Internal Medicine

## 2010-05-28 ENCOUNTER — Encounter: Payer: Self-pay | Admitting: Internal Medicine

## 2010-05-28 VITALS — BP 126/78 | HR 72 | Wt 177.2 lb

## 2010-05-28 DIAGNOSIS — I10 Essential (primary) hypertension: Secondary | ICD-10-CM

## 2010-05-28 DIAGNOSIS — E785 Hyperlipidemia, unspecified: Secondary | ICD-10-CM

## 2010-05-28 MED ORDER — LOSARTAN POTASSIUM-HCTZ 50-12.5 MG PO TABS: 1.0000 | ORAL_TABLET | Freq: Every day | ORAL | Status: DC

## 2010-05-28 MED ORDER — SIMVASTATIN 20 MG PO TABS: 20.0000 mg | ORAL_TABLET | Freq: Every evening | ORAL | Status: DC

## 2010-05-28 NOTE — Progress Notes (Signed)
Subjective:    Patient ID: Cynthia Wagner, female    DOB: 04-29-1950, 60 y.o.   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 PTE                                                                              Nutrition: no specific diet    Exercise: 3X/ week on treadmill X 30 min   Diabetes : no   Smoking : never   HTN: averages 126/74     Weight :   Variable 10#              ROS: fatigue: no  ; chest pain : no ;claudication: no; palpitations:  Occasional , not on treadmill ; abd pain/bowel changes: IBS, constipation predominant  ; myalgias:no;  syncope :  no  ; memory loss:  Not significant ;skin changes: drier      Review of Systems     Objective:   Physical Exam  Gen.: Healthy and well-nourished in appearance. Alert, appropriate and cooperative throughout exam. Lungs: Normal respiratory effort; chest expands symmetrically. Lungs are clear to auscultation without rales, wheezes, or increased work of breathing. Heart:  Slow rate and rhythm. Normal S1 and S2. No gallop, click, or rub. No  murmur. Vascular: Carotid, radial artery, dorsalis pedis and dorsalis posterior tibial pulses are full and equal. No bruits present. Neurologic: Alert and oriented x3.  Skin: Intact without suspicious lesions or rashes. Psych: Mood and affect are normal. 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 Interventions to raise the HDL should be pursued. She will determine whether she  wants to use a prescription low-dose niacin product. Her concerns are a history of gout in  her  brother and father.

## 2010-05-28 NOTE — Patient Instructions (Addendum)
Preventive Health Care: Exercise  30-45  minutes a day, 3-4 days a week. Walking is especially valuable in preventing Osteoporosis. Eat a low-fat diet with lots of fruits and vegetables, up to 7-9 servings per day.    Please check a fasting routine lipid panel, AST, and ALT in 10 weeks after the medicines have been  changed. (270.4, 995.20)

## 2010-05-28 NOTE — Assessment & Plan Note (Signed)
NMR Lipoprofile 2005: LDL 43(784/334), HDL 51, TG 80. LDL goal =< 100

## 2010-05-31 ENCOUNTER — Encounter: Payer: Self-pay | Admitting: Internal Medicine

## 2010-06-19 ENCOUNTER — Encounter: Payer: Self-pay | Admitting: Family Medicine

## 2010-06-22 NOTE — Assessment & Plan Note (Signed)
[facility]                         GASTROENTEROLOGY OFFICE NOTE   JADAYA, SOMMERFIELD                       MRN:          [id number]  DATE:06/04/2006                            DOB:          [birth year]    Ms. Froberg is a very nice 60 year old African-American female with  irritable bowel syndrome/constipation.  She called 4 days ago with a  large amount of bright red blood per rectum.  She has a history of  symptomatic hemorrhoids.  She had hemorrhoidal banding about 15 years  ago.  We had done a last colonoscopy on her on December 04, 2004, which  was a normal exam, no evidence of colitis.  She was put on MiraLax 17 g.  She has taken on a p.r.n. basis.  Since the episode of bleeding 4 days  ago, she has had only a small amount of blood per rectum, associated  with rectal irritation.  We have sent her Anusol HC suppository, which  she has taken now for 4 days, and with marked improvement of her  symptoms.   PHYSICAL EXAM:  Blood pressure 110/78, pulse 76, and weight 169 pounds.  She was alert and oriented, in no acute distress.  COR:  Normal S1, normal S2.  ABDOMEN:  Soft and nontender.  Normoactive bowel sounds.  RECTAL:  Anoscopic exam shows normal perianal area.  Rectal tone was  normal.  There were 2+ inflamed to red bleeding hemorrhoids internally,  1 of them was bleeding actively, 2 of them were just hyperemic and  bluish in discoloration.  There was no prolapse.  Stool itself was  Hemoccult negative.   IMPRESSION:  Symptomatic first-grade hemorrhoid, bleeding.   PLAN:  1. Continue Anusol HC suppositories 1 nightly.  2. Analpram cream 2.5% use t.i.d. during the day.  3. Stool softener daily.  4. If symptoms continue, consider banding.     Hedwig Morton. Juanda Chance, MD  Electronically Signed    DMB/MedQ  DD: 06/04/2006  DT: 06/04/2006  Job #: [id number]   cc:   Titus Dubin. Alwyn Ren, MD,FACP,FCCP

## 2010-07-04 ENCOUNTER — Telehealth: Payer: Self-pay

## 2010-07-04 DIAGNOSIS — E785 Hyperlipidemia, unspecified: Secondary | ICD-10-CM

## 2010-07-04 MED ORDER — EZETIMIBE-SIMVASTATIN 10-20 MG PO TABS: 1.0000 | ORAL_TABLET | Freq: Every day | ORAL | Status: DC

## 2010-07-04 NOTE — Telephone Encounter (Signed)
Pt called says that she would like to go back on Vytorin since it is causing more constipation and since she has IBS doesn't want to have to deal with this and would rather just go back to what she was already taking.

## 2010-08-02 ENCOUNTER — Telehealth: Payer: Self-pay | Admitting: Internal Medicine

## 2010-08-02 DIAGNOSIS — Z780 Asymptomatic menopausal state: Secondary | ICD-10-CM

## 2010-08-02 NOTE — Telephone Encounter (Signed)
Pt called needs to have bone density set up at [facility] since she is already scheduled to have mammogram done on July 2

## 2010-08-03 NOTE — Telephone Encounter (Signed)
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

## 2010-08-03 NOTE — Telephone Encounter (Signed)
Pt aware ordered put in.

## 2010-08-06 ENCOUNTER — Other Ambulatory Visit: Payer: BC Managed Care – PPO

## 2010-08-16 ENCOUNTER — Encounter: Payer: Self-pay | Admitting: Internal Medicine

## 2010-09-10 ENCOUNTER — Other Ambulatory Visit: Payer: BC Managed Care – PPO

## 2010-11-13 ENCOUNTER — Other Ambulatory Visit: Payer: Self-pay | Admitting: Obstetrics and Gynecology

## 2011-03-25 ENCOUNTER — Other Ambulatory Visit: Payer: Self-pay | Admitting: Internal Medicine

## 2011-04-29 ENCOUNTER — Ambulatory Visit (INDEPENDENT_AMBULATORY_CARE_PROVIDER_SITE_OTHER): Payer: BC Managed Care – PPO | Admitting: Internal Medicine

## 2011-04-29 ENCOUNTER — Ambulatory Visit: Payer: BC Managed Care – PPO

## 2011-04-29 ENCOUNTER — Encounter: Payer: Self-pay | Admitting: Internal Medicine

## 2011-04-29 VITALS — BP 136/80 | HR 60 | Temp 98.3°F | Resp 12 | Ht 61.5 in | Wt 178.6 lb

## 2011-04-29 DIAGNOSIS — Z23 Encounter for immunization: Secondary | ICD-10-CM

## 2011-04-29 DIAGNOSIS — R9431 Abnormal electrocardiogram [ECG] [EKG]: Secondary | ICD-10-CM

## 2011-04-29 DIAGNOSIS — Z2911 Encounter for prophylactic immunotherapy for respiratory syncytial virus (RSV): Secondary | ICD-10-CM

## 2011-04-29 DIAGNOSIS — E785 Hyperlipidemia, unspecified: Secondary | ICD-10-CM

## 2011-04-29 DIAGNOSIS — Z0000 Encounter for general adult medical examination without abnormal findings: Secondary | ICD-10-CM

## 2011-04-29 LAB — BASIC METABOLIC PANEL
CO2: 28 mEq/L (ref 19–32)
Calcium: 9.2 mg/dL (ref 8.4–10.5)
Creatinine, Ser: 1 mg/dL (ref 0.4–1.2)
Sodium: 141 mEq/L (ref 135–145)

## 2011-04-29 LAB — CBC WITH DIFFERENTIAL/PLATELET
Basophils Relative: 0.7 % (ref 0.0–3.0)
Eosinophils Absolute: 0.1 10*3/uL (ref 0.0–0.7)
Eosinophils Relative: 1.2 % (ref 0.0–5.0)
Hemoglobin: 13.9 g/dL (ref 12.0–15.0)
Lymphocytes Relative: 38 % (ref 12.0–46.0)
MCHC: 33.1 g/dL (ref 30.0–36.0)
Monocytes Relative: 6.6 % (ref 3.0–12.0)
Neutro Abs: 2.4 10*3/uL (ref 1.4–7.7)
Neutrophils Relative %: 53.5 % (ref 43.0–77.0)
RBC: 4.67 Mil/uL (ref 3.87–5.11)
WBC: 4.4 10*3/uL — ABNORMAL LOW (ref 4.5–10.5)

## 2011-04-29 LAB — HEPATIC FUNCTION PANEL
ALT: 18 U/L (ref 0–35)
AST: 22 U/L (ref 0–37)
Albumin: 3.9 g/dL (ref 3.5–5.2)
Alkaline Phosphatase: 81 U/L (ref 39–117)
Bilirubin, Direct: 0.1 mg/dL (ref 0.0–0.3)
Total Protein: 7.1 g/dL (ref 6.0–8.3)

## 2011-04-29 LAB — LIPID PANEL
HDL: 42.9 mg/dL (ref >39.00)
Total CHOL/HDL Ratio: 2

## 2011-04-29 LAB — TSH: TSH: 1.56 u[IU]/mL (ref 0.35–5.50)

## 2011-04-29 NOTE — Progress Notes (Signed)
  Subjective:    Patient ID: Cynthia Wagner, female    DOB: 12/11/1950, 61 y.o.   MRN: [id number]  HPI  Cynthia Wagner is here for a physical;acute issues include intermittent palpitations for which she takes as needed Beta blocker.      Review of Systems HYPERTENSION: Disease Monitoring: Blood pressure average- 130/76 Chest pain, palpitations-occasional palpitations w/o trigger       Dyspnea- no Medications: Compliance- yes  Lightheadedness,Syncope- no    Edema- no  HYPERLIPIDEMIA: Disease Monitoring: See symptoms for Hypertension Medications: Compliance- yes  Abd pain, bowel changes- constipation (PMH of IBS)   Muscle aches- no Polyuria/phagia/dipsia- no      Visual problems- no , uses Restasis          Objective:   Physical Exam Gen.: Healthy and well-nourished in appearance. Alert, appropriate and cooperative throughout exam. Head: Normocephalic without obvious abnormalities  Eyes: No corneal or conjunctival inflammation noted. Pupils equal round reactive to light and accommodation. Fundal exam is benign without hemorrhages, exudate, papilledema. Extraocular motion intact. Vision grossly normal w/o lenses. Ears: External  ear exam reveals no significant lesions or deformities. Canals clear .TMs normal. Hearing is grossly normal bilaterally. Nose: External nasal exam reveals no deformity or inflammation. Nasal mucosa are pink and moist. No lesions or exudates noted.   Mouth: Oral mucosa and oropharynx reveal no lesions or exudates. Teeth in good repair. Neck: No deformities, masses, or tenderness noted. Range of motion & Thyroid normal. Lungs: Normal respiratory effort; chest expands symmetrically. Lungs are clear to auscultation without rales, wheezes, or increased work of breathing. Heart: Normal rate and rhythm. Normal S1 ; split  S2. No gallop, click, or rub. No murmur. Abdomen: Bowel sounds normal; abdomen soft and nontender. No masses, organomegaly or hernias  noted. Genitalia: Dr Vincente Poli.                                                                                   Musculoskeletal/extremities: No deformity or scoliosis noted of  the thoracic or lumbar spine. No clubbing, cyanosis, edema, or deformity noted. Range of motion  normal .Tone & strength  normal.Joints normal. Nail health  Good.Minor crepitus L knee Vascular: Carotid, radial artery, dorsalis pedis and  posterior tibial pulses are full and equal. No bruits present. Neurologic: Alert and oriented x3. Deep tendon reflexes symmetrical and normal.          Skin: Intact without suspicious lesions or rashes. Lymph: No cervical, axillary lymphadenopathy present. Psych: Mood and affect are normal. Normally interactive                                                                                         Assessment & Plan:  #1 comprehensive physical exam; no acute findings #2 see Problem List with Assessments & Recommendations Plan: see Orders

## 2011-04-29 NOTE — Patient Instructions (Signed)
Preventive Health Care: Exercise  30-45  minutes a day, 3-4 days a week. Walking is especially valuable in preventing Osteoporosis. Eat a low-fat diet with lots of fruits and vegetables, up to 7-9 servings per day. Consume less than 30 grams of sugar per day from foods & drinks with High Fructose Corn Syrup as # 1,2,3 or #4 on label.  Blood Pressure Goal  Ideally is an AVERAGE < 135/85. This AVERAGE should be calculated from @ least 5-7 BP readings taken @ different times of day on different days of week. You should not respond to isolated BP readings , but rather the AVERAGE for that week  

## 2011-04-30 LAB — VITAMIN D 25 HYDROXY (VIT D DEFICIENCY, FRACTURES): Vit D, 25-Hydroxy: 44 ng/mL (ref 30–89)

## 2011-05-09 ENCOUNTER — Other Ambulatory Visit: Payer: Self-pay | Admitting: Internal Medicine

## 2011-06-25 HISTORY — PX: HAMMER TOE SURGERY: SHX385

## 2011-06-28 ENCOUNTER — Telehealth: Payer: Self-pay | Admitting: *Deleted

## 2011-06-28 NOTE — Telephone Encounter (Signed)
Left a message for the pt to return my call so I can schedule a genetics appt.

## 2011-07-03 ENCOUNTER — Telehealth: Payer: Self-pay | Admitting: *Deleted

## 2011-07-03 NOTE — Telephone Encounter (Signed)
Left message for pt to return my call so I can schedule a genetic appt.  

## 2011-07-08 ENCOUNTER — Telehealth: Payer: Self-pay | Admitting: *Deleted

## 2011-07-08 NOTE — Telephone Encounter (Signed)
Left genetic appt info on Cynthia Wagner's voicemail to make her aware of the appt.

## 2011-07-08 NOTE — Telephone Encounter (Signed)
Confirmed 07/11/11 genetics appt w/ pt.  Took paperwork to Clydie Braun.

## 2011-07-11 ENCOUNTER — Ambulatory Visit (HOSPITAL_BASED_OUTPATIENT_CLINIC_OR_DEPARTMENT_OTHER): Payer: BC Managed Care – PPO | Admitting: Genetic Counselor

## 2011-07-11 ENCOUNTER — Other Ambulatory Visit: Payer: BC Managed Care – PPO | Admitting: Lab

## 2011-07-11 DIAGNOSIS — Z803 Family history of malignant neoplasm of breast: Secondary | ICD-10-CM

## 2011-07-11 NOTE — Progress Notes (Signed)
Dr.  Thana Ates requested a consultation for genetic counseling and risk assessment for Cynthia Wagner, a 61 y.o. female, for discussion of her family history of breast cancer. She presents to clinic today, with her husband Colon Branch, to discuss the possibility of a genetic predisposition to cancer, and to further clarify her risks, as well as her family members' risks for cancer.   HISTORY OF PRESENT ILLNESS: Cynthia Wagner is a 61 y.o. female with no personal history of cancer.    Past Medical History  Diagnosis Date  . RUQ abdominal pain 2005    negative GB scan  . Hyperlipidemia   . Hypertension     Past Surgical History  Procedure Date  . Abdominal hysterectomy 1995    For uterine Fibroids, no BSO  . Carpal tunnel release 1991    bilaterally  . Colonoscopy 2003& 2006    Negative  . Vulva /perineum biopsy   . Hemorrhoid surgery     infrared coagulation  . Esophagogastroduodenoscopy 10-11-2003    History  Substance Use Topics  . Smoking status: Never Smoker   . Smokeless tobacco: Not on file  . Alcohol Use: 1.8 oz/week    3 Glasses of wine per week     Wine    REPRODUCTIVE HISTORY AND PERSONAL RISK ASSESSMENT FACTORS: Menarche was at age 23.   Menopausal Uterus Intact: No Ovaries Intact: Yes G2P2A0 , first live birth at age 76  She has not previously undergone treatment for infertility.   OCP use for 6 years   She has used HRT in the past.    FAMILY HISTORY:  We obtained a detailed, 4-generation family history.  Significant diagnoses are listed below: Family History  Problem Relation Age of Onset  . Heart attack Father 47  . Stroke Mother 11  . Breast cancer Sister   . Pulmonary embolism Sister     DVT in context breast cancer  . Hypertension Brother   . Tuberculosis Maternal Uncle   . Diabetes Maternal Aunt   The patient had a sister who was diagnosed with breast cancer at age 24.  The patient's mother did not have cancer.  Her mother had several maternal and  paternal half siblings, but did not grow up with them and there is no information about these individuals.  The patient's father died of a heart attack at age 94.  He had multiple brothers and sisters, none of whom are known to have had cancer.  There is no other reported cancer history on either side of the family.  Patient's maternal ancestors are of [place] Amerian descent, and paternal ancestors are of African American descent. There is no reported Ashkenazi Jewish ancestry. There is no  known consanguinity.  GENETIC COUNSELING RISK ASSESSMENT, DISCUSSION, AND SUGGESTED FOLLOW UP: We reviewed the natural history and genetic etiology of sporadic, familial and hereditary cancer syndromes.   About 5-10% of breast cancer is hereditary.  Of this, about 85% is the result of a BRCA1 or BRCA2 mutation.  We reviewed the red flags of hereditary cancer syndromes and the dominant inheritance patterns.  The patient's family history is suggestive of the following possible diagnosis: hereditary cancer syndrome  We discussed that identification of a hereditary cancer syndrome may help her care providers tailor the patients medical management. If a mutation indicating hereditary cancer syndrome is detected in this case, the [employer] recommendations would include increased cancer survellance. If a mutation is detected, the patient will be referred  back to the referring provider and to any additional appropriate care providers to discuss the relevant options.   If a mutation is not found in the patient, this will decrease the likelihood of hereditary cancer syndrome as the explanation for her family history of breast cancer. Cancer surveillance options would be discussed for the patient according to the appropriate standard National Comprehensive Cancer Network and American Cancer Society guidelines, with consideration of their personal and family history risk factors. In this case, the  patient will be referred back to their care providers for discussions of management.   In order to estimate her chance of having a BRCA1 or BRCA2 mutation, we used statistical models (Penn II) and laboratory data that take into account her personal medical history, family history and ancestry.  Because each model is different, there can be a lot of variability in the risks they give.  Therefore, these numbers must be considered a rough range and not a precise risk of having a BRCA1 and BRCA2 mutation.  These models estimate that she has approximately a 4% chance of having a mutation.   After considering the risks, benefits, and limitations, the patient provided informed consent for  the following testing:  Bracanalysis through [facility].   Per the patient's request, we will contact her by telephone to discuss these results. A follow up genetic counseling visit will be scheduled if indicated.  The patient was seen for a total of 60 minutes, greater than 50% of which was spent face-to-face counseling.  This plan is being carried out per Dr. Holley Bouche recommendations.  This note will also be sent to the referring provider via the electronic medical record. The patient will be supplied with a summary of this genetic counseling discussion as well as educational information on the discussed hereditary cancer syndromes following the conclusion of their visit.   Patient was discussed with Dr. Drue Second.  EDUCATIONAL INFORMATION SUPPLIED TO PATIENT AT ENCOUNTER:  Hereditary breast and ovarian cancer syndrome  _______________________________________________________________________ For Office Staff:  Number of people involved in session: 3 Was an Intern/ student involved with case: not applicable

## 2011-07-29 ENCOUNTER — Telehealth: Payer: Self-pay | Admitting: Genetic Counselor

## 2011-07-29 NOTE — Telephone Encounter (Signed)
Revealed negative BRCA and BART results. 

## 2011-07-30 ENCOUNTER — Encounter: Payer: Self-pay | Admitting: Genetic Counselor

## 2011-08-02 ENCOUNTER — Telehealth: Payer: Self-pay | Admitting: *Deleted

## 2011-08-02 NOTE — Telephone Encounter (Signed)
Pt called requesting that recent labs be faxed to Dr Arty Baumgartner. Advise Pt labs faxed.

## 2011-08-05 ENCOUNTER — Telehealth: Payer: Self-pay | Admitting: Internal Medicine

## 2011-08-05 NOTE — Telephone Encounter (Signed)
Pt return call stating that she contact her insurance and it does not require a referral so she has already schedule with Dr Loreta Ave.

## 2011-08-05 NOTE — Telephone Encounter (Signed)
Left message to call office

## 2011-08-05 NOTE — Telephone Encounter (Signed)
Pt would like to change from Dr. Dickie La to Dr. Loreta Ave and would like to know if she will need a referral. She would also like a copy of her lab results faxed to her at home. Her fax # is [phone]

## 2011-09-30 ENCOUNTER — Ambulatory Visit (INDEPENDENT_AMBULATORY_CARE_PROVIDER_SITE_OTHER): Payer: BC Managed Care – PPO | Admitting: Internal Medicine

## 2011-09-30 ENCOUNTER — Encounter: Payer: Self-pay | Admitting: Internal Medicine

## 2011-09-30 VITALS — BP 124/78 | HR 74 | Temp 98.7°F | Wt 171.6 lb

## 2011-09-30 DIAGNOSIS — R609 Edema, unspecified: Secondary | ICD-10-CM

## 2011-09-30 LAB — BUN: BUN: 17 mg/dL (ref 6–23)

## 2011-09-30 LAB — AST: AST: 21 U/L (ref 0–37)

## 2011-09-30 LAB — ALT: ALT: 14 U/L (ref 0–35)

## 2011-09-30 LAB — CREATININE, SERUM: Creatinine, Ser: 0.9 mg/dL (ref 0.4–1.2)

## 2011-09-30 NOTE — Progress Notes (Signed)
  Subjective:    Patient ID: Cynthia Wagner, female    DOB: 1950-02-20, 61 y.o.   MRN: [id number]  HPI She describes "intense swelling" of the left foot and ankle as of 09/27/11. Swelling  actually began to some extent 8/10 after her trip to [facility]. Elevation and application of ice helped the edema. Additionally furosemide prescribed by her podiatrist was of benefit.  She did have hammertoe surgery 06/25/11 but did not have significant swelling postop.  She will work 10 hour shifts and has some associated edema but not to this extent.  Past medical,surgical, social,and family history were reviewed. Her sister had deep venous thrombosis and pulmonary thromboemboli in the context of breast cancer.    Review of Systems She denies fever, chills, sweats. She's had no calf pain, chest pain,palpitations, dyspnea,PND  or hemoptysis. She is on CPAP with control of Sleep Apnea.     Objective:   Physical Exam  General appearance is one of good health and nourishment w/o distress.  Eyes: No conjunctival inflammation or scleral icterus is present.  Neck: No neck vein distention at 10; thyroid normal    Heart:  Normal rate and regular rhythm. S1 and S2 normal without gallop, murmur, click, rub .  S 4.    Lungs:Chest clear to auscultation; no wheezes, rhonchi,rales ,or rubs present.No increased work of breathing.   Abdomen: bowel sounds normal, soft and non-tender without masses, organomegaly or hernias noted.  No guarding or rebound . No hepatojugular reflux present  Skin:Warm & dry.  Intact without suspicious lesions or rashes   Extremities: Homans sign is negative bilaterally.Pes planus. There is some ill-defined subcutaneous irregularity along the left anterior shin  Vascular: All pulses intact. She does have lipidedematous changes at the ankles without pitting edema.  Lymphatic: No lymphadenopathy is noted about the head, neck, axilla areas.              Assessment & Plan:  #1  lipedematous changes left greater than right ankle. These may be related to some instability of the ankle.She has some similar changes in the left shin area. Orthopedic evaluation would be appropriate.  Because these changes began after a cross-country flight and there is a family history of deep venous thrombosis (sister with breast cancer); d-dimer screen will be performed. Additionally hepatorenal function will be checked; these were last checked in March.  If d-dimer is elevated venous Doppler would be indicated.

## 2011-09-30 NOTE — Patient Instructions (Addendum)
Please try to go on My Chart within the next 24 hours to allow me to release the results directly to you.  Review and correct the record as indicated. Please share record with all medical staff seen.

## 2011-10-01 ENCOUNTER — Other Ambulatory Visit: Payer: Self-pay

## 2011-10-01 LAB — D-DIMER, QUANTITATIVE: D-Dimer, Quant: 0.43 ug/mL-FEU (ref 0.00–0.48)

## 2011-10-01 MED ORDER — FUROSEMIDE 20 MG PO TABS: 20.0000 mg | ORAL_TABLET | Freq: Every day | ORAL | Status: DC

## 2011-10-01 NOTE — Telephone Encounter (Signed)
RX sent in

## 2011-10-01 NOTE — Telephone Encounter (Signed)
Message copied by Maurice Small on Tue Oct 01, 2011  1:54 PM ------      Message from: Pecola Lawless      Created: Tue Oct 01, 2011 12:50 PM       Please call Rx  to drug store for Furosemide 20 mg qd prn , #30

## 2011-10-02 ENCOUNTER — Other Ambulatory Visit: Payer: Self-pay | Admitting: Internal Medicine

## 2011-10-02 ENCOUNTER — Encounter: Payer: Self-pay | Admitting: Internal Medicine

## 2011-10-02 DIAGNOSIS — R600 Localized edema: Secondary | ICD-10-CM

## 2011-10-02 DIAGNOSIS — M79669 Pain in unspecified lower leg: Secondary | ICD-10-CM

## 2011-11-12 ENCOUNTER — Encounter: Payer: Self-pay | Admitting: Internal Medicine

## 2011-11-18 ENCOUNTER — Other Ambulatory Visit: Payer: Self-pay | Admitting: Obstetrics and Gynecology

## 2012-02-07 ENCOUNTER — Telehealth: Payer: Self-pay | Admitting: Internal Medicine

## 2012-02-07 MED ORDER — FUROSEMIDE 20 MG PO TABS: 20.0000 mg | ORAL_TABLET | Freq: Every day | ORAL | Status: DC

## 2012-02-07 NOTE — Telephone Encounter (Signed)
Refill: Metoprolol tartrate 50 mg tab. Take as directed. Qty 30. Last fill 01-02-10

## 2012-02-07 NOTE — Telephone Encounter (Signed)
Furosemide filled, metoprolol is not on medication list. I called patient and was told by Mr.Rueger patient is in the operating room and he will have her call me when available.   I need to find out form patient who originally rx'ed metoprolol and ? Why it was discontinued for it is not currently on med list.

## 2012-02-07 NOTE — Telephone Encounter (Signed)
Refill: Furosemide 20 mg tab. Take one tablet by mouth one time daily. Qty 30. Last fill 10-06-11

## 2012-02-10 MED ORDER — METOPROLOL TARTRATE 50 MG PO TABS: 50.0000 mg | ORAL_TABLET | ORAL | Status: DC

## 2012-02-10 NOTE — Telephone Encounter (Signed)
Spoke with patient, patient states she takes metoprolol prn for palpitations

## 2012-02-17 ENCOUNTER — Other Ambulatory Visit: Payer: Self-pay | Admitting: Obstetrics and Gynecology

## 2012-03-21 ENCOUNTER — Other Ambulatory Visit: Payer: Self-pay

## 2012-04-02 ENCOUNTER — Other Ambulatory Visit: Payer: Self-pay | Admitting: Internal Medicine

## 2012-05-04 ENCOUNTER — Telehealth: Payer: Self-pay | Admitting: Internal Medicine

## 2012-05-04 ENCOUNTER — Encounter: Payer: Self-pay | Admitting: Internal Medicine

## 2012-05-04 ENCOUNTER — Ambulatory Visit (INDEPENDENT_AMBULATORY_CARE_PROVIDER_SITE_OTHER): Payer: BC Managed Care – PPO | Admitting: Internal Medicine

## 2012-05-04 VITALS — BP 118/70 | HR 63 | Temp 98.2°F | Resp 12 | Ht 61.08 in | Wt 173.0 lb

## 2012-05-04 DIAGNOSIS — I10 Essential (primary) hypertension: Secondary | ICD-10-CM

## 2012-05-04 DIAGNOSIS — L439 Lichen planus, unspecified: Secondary | ICD-10-CM | POA: Insufficient documentation

## 2012-05-04 DIAGNOSIS — Z0000 Encounter for general adult medical examination without abnormal findings: Secondary | ICD-10-CM

## 2012-05-04 LAB — CBC WITH DIFFERENTIAL/PLATELET
Basophils Relative: 1.8 % (ref 0.0–3.0)
Eosinophils Absolute: 0.1 10*3/uL (ref 0.0–0.7)
Hemoglobin: 13.4 g/dL (ref 12.0–15.0)
MCHC: 32.9 g/dL (ref 30.0–36.0)
MCV: 90.7 fl (ref 78.0–100.0)
Monocytes Absolute: 0.3 10*3/uL (ref 0.1–1.0)
Neutro Abs: 2.4 10*3/uL (ref 1.4–7.7)
Neutrophils Relative %: 57.7 % (ref 43.0–77.0)
RBC: 4.5 Mil/uL (ref 3.87–5.11)

## 2012-05-04 LAB — LIPID PANEL
Cholesterol: 159 mg/dL (ref 0–200)
HDL: 42 mg/dL (ref >39.00)
Triglycerides: 73 mg/dL (ref 0.0–149.0)

## 2012-05-04 LAB — HEPATIC FUNCTION PANEL
Albumin: 4.1 g/dL (ref 3.5–5.2)
Total Protein: 7.2 g/dL (ref 6.0–8.3)

## 2012-05-04 LAB — BASIC METABOLIC PANEL
CO2: 29 mEq/L (ref 19–32)
Chloride: 100 mEq/L (ref 96–112)
Creatinine, Ser: 1 mg/dL (ref 0.4–1.2)

## 2012-05-04 MED ORDER — LOSARTAN POTASSIUM-HCTZ 50-12.5 MG PO TABS: ORAL_TABLET | ORAL | Status: DC

## 2012-05-04 NOTE — Telephone Encounter (Signed)
Spoke with patient to inform her that we can mail but per MD she will need to deactivate mychart if she prefers to have labs sent via mail. Patient indicated she would rather use mychart

## 2012-05-04 NOTE — Patient Instructions (Addendum)
Preventive Health Care: Exercise  30-45  minutes a day, 3-4 days a week. Walking is especially valuable in preventing Osteoporosis. Eat a low-fat diet with lots of fruits and vegetables, up to 7-9 servings per day. This would eliminate need for vitamin supplements for most individuals. Consume less than 30 grams of sugar per day from foods & drinks with High Fructose Corn Syrup as #1,2,3 or #4 on label. Take the EKG to any emergency room or preop visits. There are nonspecific changes; as long as there is no new change these are not clinically significant . If the old EKG is not available for comparison; it may result in unnecessary hospitalization for observation with significant unnecessary expense. Review and correct the record as indicated. Please share record with all medical staff seen.

## 2012-05-04 NOTE — Progress Notes (Signed)
  Subjective:    Patient ID: Cynthia Wagner, female    DOB: [birth year], 61 y.o.   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     Review of Systems  Cynthia Wagner initiated Plaquenil and Elidel in January of this year for lichen planus. At that time the Vytorin was discontinued because of potential drug: drug interaction and hepatotoxicity.  Cynthia Wagner has prescribed probiotics & Linzess for IBS with response.     Objective:   Physical Exam Gen.: Healthy and well-nourished in appearance. Alert, appropriate and cooperative throughout exam. Eyes: No corneal or conjunctival inflammation noted. Pupils equal round reactive to light and accommodation.  Extraocular motion intact. Vision grossly normal with lenses Ears: External  ear exam reveals no significant lesions or deformities. Canals clear .TMs normal. Hearing is grossly normal bilaterally. Nose: External nasal exam reveals no deformity or inflammation. Nasal mucosa are pink and moist. No lesions or exudates noted.  Mouth: Oral mucosa and oropharynx reveal no lesions or exudates. Teeth in good repair. Neck: No deformities, masses, or tenderness noted. Range of motion & Thyroid normal. Lungs: Normal respiratory effort; chest expands symmetrically. Lungs are clear to auscultation without rales, wheezes, or increased work of breathing. Heart: Normal rate and rhythm. Normal S1 and S2. No gallop, click, or rub. S4 with faint  slurring at  LSB Abdomen: Bowel sounds normal; abdomen soft and nontender. No masses, organomegaly or hernias noted. Genitalia: As per Gyn, Cynthia Wagner                                Musculoskeletal/extremities: No deformity or scoliosis noted of  the thoracic or lumbar spine.  No clubbing, cyanosis, edema, or significant extremity  deformity noted. Range of motion normal .Tone & strength  Normal.Mild crepitus knees;L > R.Joints normal. Nail health good.Pes planus. Able to  lie down & sit up w/o help. Negative SLR bilaterally Vascular: Carotid, radial artery, dorsalis pedis and  posterior tibial pulses are full and equal. No bruits present. Neurologic: Alert and oriented x3. Deep tendon reflexes symmetrical and normal.       Skin: Intact without suspicious lesions or rashes. Lymph: No cervical, axillary lymphadenopathy present. Psych: Mood and affect are normal. Normally interactive                                                                                      Assessment & Plan:  #1 comprehensive physical exam; no acute findings #2 minor nonspecific ST-T changes; possible slight progression. Cardiology evaluation recommended; she wants to defer this until her daughter's upcoming wedding 05/23/2012 Plan: see Orders  & Recommendations

## 2012-05-04 NOTE — Telephone Encounter (Signed)
Patient states that she would like the lab results from today mailed to her.

## 2012-05-05 ENCOUNTER — Telehealth: Payer: Self-pay | Admitting: Internal Medicine

## 2012-05-05 NOTE — Telephone Encounter (Signed)
Patient called back, wants cardiology appointment before 05/23/12, a Monday preferred.  Please advise.

## 2012-05-05 NOTE — Telephone Encounter (Signed)
Patient would like to be referred to Dr. Verdis Prime III [facility] cardiology) [phone]. B# [phone]

## 2012-05-05 NOTE — Telephone Encounter (Signed)
Hopp please advise  

## 2012-05-06 ENCOUNTER — Encounter: Payer: Self-pay | Admitting: Internal Medicine

## 2012-05-06 ENCOUNTER — Other Ambulatory Visit: Payer: Self-pay | Admitting: Internal Medicine

## 2012-05-06 DIAGNOSIS — I10 Essential (primary) hypertension: Secondary | ICD-10-CM

## 2012-05-06 DIAGNOSIS — R9431 Abnormal electrocardiogram [ECG] [EKG]: Secondary | ICD-10-CM

## 2012-05-08 LAB — VITAMIN D 1,25 DIHYDROXY: Vitamin D2 1, 25 (OH)2: <8 pg/mL

## 2012-08-01 ENCOUNTER — Encounter: Payer: Self-pay | Admitting: Internal Medicine

## 2012-08-03 ENCOUNTER — Other Ambulatory Visit: Payer: Self-pay | Admitting: Internal Medicine

## 2012-08-03 ENCOUNTER — Encounter: Payer: Self-pay | Admitting: Internal Medicine

## 2012-08-03 DIAGNOSIS — E559 Vitamin D deficiency, unspecified: Secondary | ICD-10-CM

## 2012-08-03 DIAGNOSIS — N959 Unspecified menopausal and perimenopausal disorder: Secondary | ICD-10-CM | POA: Insufficient documentation

## 2012-08-25 ENCOUNTER — Encounter: Payer: Self-pay | Admitting: Internal Medicine

## 2012-09-04 ENCOUNTER — Encounter: Payer: Self-pay | Admitting: Internal Medicine

## 2012-12-10 ENCOUNTER — Other Ambulatory Visit: Payer: Self-pay

## 2012-12-25 ENCOUNTER — Other Ambulatory Visit: Payer: Self-pay | Admitting: Internal Medicine

## 2012-12-25 ENCOUNTER — Telehealth: Payer: Self-pay

## 2012-12-25 DIAGNOSIS — I10 Essential (primary) hypertension: Secondary | ICD-10-CM

## 2012-12-25 MED ORDER — LOSARTAN POTASSIUM-HCTZ 50-12.5 MG PO TABS: ORAL_TABLET | ORAL | Status: DC

## 2012-12-25 NOTE — Telephone Encounter (Signed)
patient called to have her medication refilled for her Losartan. Rx sent, she said she was hoarse but was not feeling bad, I made her aware to try Saturday clinic.       KP

## 2012-12-27 NOTE — Telephone Encounter (Signed)
Losartan-HCTZ refilled per protocol 

## 2013-03-29 ENCOUNTER — Other Ambulatory Visit: Payer: Self-pay | Admitting: Obstetrics and Gynecology

## 2013-05-05 ENCOUNTER — Encounter: Payer: Self-pay | Admitting: Internal Medicine

## 2013-05-10 ENCOUNTER — Other Ambulatory Visit (INDEPENDENT_AMBULATORY_CARE_PROVIDER_SITE_OTHER): Payer: BC Managed Care – PPO

## 2013-05-10 ENCOUNTER — Encounter: Payer: Self-pay | Admitting: Internal Medicine

## 2013-05-10 ENCOUNTER — Ambulatory Visit (INDEPENDENT_AMBULATORY_CARE_PROVIDER_SITE_OTHER): Payer: BC Managed Care – PPO | Admitting: Internal Medicine

## 2013-05-10 VITALS — BP 128/80 | HR 62 | Temp 98.5°F | Resp 14 | Ht 61.5 in | Wt 181.0 lb

## 2013-05-10 DIAGNOSIS — Z0000 Encounter for general adult medical examination without abnormal findings: Secondary | ICD-10-CM

## 2013-05-10 DIAGNOSIS — R9431 Abnormal electrocardiogram [ECG] [EKG]: Secondary | ICD-10-CM

## 2013-05-10 LAB — CBC WITH DIFFERENTIAL/PLATELET
BASOS ABS: 0 10*3/uL (ref 0.0–0.1)
BASOS PCT: 0.6 % (ref 0.0–3.0)
EOS ABS: 0.2 10*3/uL (ref 0.0–0.7)
Eosinophils Relative: 4 % (ref 0.0–5.0)
HCT: 42.2 % (ref 36.0–46.0)
Hemoglobin: 14.1 g/dL (ref 12.0–15.0)
Lymphocytes Relative: 31.9 % (ref 12.0–46.0)
Lymphs Abs: 1.8 10*3/uL (ref 0.7–4.0)
MCHC: 33.5 g/dL (ref 30.0–36.0)
MCV: 88.9 fl (ref 78.0–100.0)
Monocytes Absolute: 0.4 10*3/uL (ref 0.1–1.0)
Monocytes Relative: 7 % (ref 3.0–12.0)
NEUTROS PCT: 56.5 % (ref 43.0–77.0)
Neutro Abs: 3.1 10*3/uL (ref 1.4–7.7)
Platelets: 213 10*3/uL (ref 150.0–400.0)
RBC: 4.74 Mil/uL (ref 3.87–5.11)
RDW: 13.3 % (ref 11.5–14.6)
WBC: 5.5 10*3/uL (ref 4.5–10.5)

## 2013-05-10 LAB — HEPATIC FUNCTION PANEL
ALT: 14 U/L (ref 0–35)
AST: 17 U/L (ref 0–37)
Albumin: 4 g/dL (ref 3.5–5.2)
Alkaline Phosphatase: 73 U/L (ref 39–117)
BILIRUBIN DIRECT: 0.1 mg/dL (ref 0.0–0.3)
TOTAL PROTEIN: 7.3 g/dL (ref 6.0–8.3)
Total Bilirubin: 0.7 mg/dL (ref 0.3–1.2)

## 2013-05-10 LAB — LIPID PANEL
CHOL/HDL RATIO: 5
Cholesterol: 183 mg/dL (ref 0–200)
HDL: 39.2 mg/dL (ref >39.00)
LDL Cholesterol: 126 mg/dL — ABNORMAL HIGH (ref 0–99)
TRIGLYCERIDES: 89 mg/dL (ref 0.0–149.0)
VLDL: 17.8 mg/dL (ref 0.0–40.0)

## 2013-05-10 LAB — BASIC METABOLIC PANEL
BUN: 16 mg/dL (ref 6–23)
CO2: 31 meq/L (ref 19–32)
CREATININE: 1.1 mg/dL (ref 0.4–1.2)
Calcium: 9.8 mg/dL (ref 8.4–10.5)
Chloride: 104 mEq/L (ref 96–112)
GFR: 68.22 mL/min (ref >60.00)
GLUCOSE: 90 mg/dL (ref 70–99)
Potassium: 4.1 mEq/L (ref 3.5–5.1)
Sodium: 141 mEq/L (ref 135–145)

## 2013-05-10 LAB — TSH: TSH: 1.5 u[IU]/mL (ref 0.35–5.50)

## 2013-05-10 NOTE — Patient Instructions (Addendum)
Your next office appointment will be determined based upon review of your pending labs . Those instructions will be transmitted to you through My Chart  Minimal Blood Pressure Goal= AVERAGE < 140/90;  Ideal is an AVERAGE < 135/85. This AVERAGE should be calculated from @ least 5-7 BP readings taken @ different times of day on different days of week. You should not respond to isolated BP readings , but rather the AVERAGE for that week .Please bring your  blood pressure cuff to office visits to verify that it is reliable.It  can also be checked against the blood pressure device at the pharmacy. Finger or wrist cuffs are not dependable; an arm cuff is. Cardiovascular exercise, this can be as simple a program as walking, is recommended 30-45 minutes 3-4 times per week. If you're not exercising you should take 6-8 weeks to build up to this level.  

## 2013-05-10 NOTE — Progress Notes (Signed)
Pre visit review using our clinic review tool, if applicable. No additional management support is needed unless otherwise documented below in the visit note. 

## 2013-05-10 NOTE — Progress Notes (Signed)
   Subjective:    Patient ID: Cynthia Wagner, female    DOB: 29-Jun-1950, 63 y.o.   MRN: [id number]  HPI  She is here for a physical;acute issues denied.  Blood pressure range / average : 130/80 Compliant with anti hypertemsive medication. No lightheadedness or other adverse medication effect described.  A heart healthy /low salt diet is followed. Exercise minimal due to family issues..  Family history is strongly + for premature MI /CVA in parents    Review of Systems  Significant headaches, epistaxis, chest pain, palpitations, exertional dyspnea, claudication, paroxysmal nocturnal dyspnea, or edema absent.     Objective:   Physical Exam Gen.: Healthy and well-nourished in appearance. Alert, appropriate and cooperative throughout exam. Appears younger than stated age  Head: Normocephalic without obvious abnormalities.  Eyes: No corneal or conjunctival inflammation noted. Pupils equal round reactive to light and accommodation. Extraocular motion intact.  Ears: External  ear exam reveals no significant lesions or deformities. Canals clear .TMs normal. Hearing is grossly normal bilaterally. Nose: External nasal exam reveals no deformity or inflammation. Nasal mucosa are pink and moist. No lesions or exudates noted.   Mouth: Oral mucosa and oropharynx reveal no lesions or exudates. Teeth in good repair. Neck: No deformities, masses, or tenderness noted. Range of motion & Thyroid normal. Lungs: Normal respiratory effort; chest expands symmetrically. Lungs are clear to auscultation without rales, wheezes, or increased work of breathing. Heart: Normal rate and rhythm. Normal S1 and S2. No gallop, click, or rub. No  murmur. Abdomen: Bowel sounds normal; abdomen soft and nontender. No masses, organomegaly or hernias noted. Genitalia:  as per Gyn                                  Musculoskeletal/extremities: No deformity or scoliosis noted of  the thoracic or lumbar spine.  No clubbing,  cyanosis, edema, or significant extremity  deformity noted. Range of motion normal .Tone & strength normal. Hand joints normal.Minor knee crepitus. Pes planus. Fingernail / toenail health good. Able to lie down & sit up w/o help. Negative SLR bilaterally Vascular: Carotid, radial artery, dorsalis pedis and  posterior tibial pulses are full and equal. No bruits present. Neurologic: Alert and oriented x3. Deep tendon reflexes symmetrical and normal.  Gait normal . Skin: Intact without suspicious lesions or rashes. Lymph: No cervical, axillary lymphadenopathy present. Psych: Mood and affect are normal. Normally interactive                                                                                        Assessment & Plan:  #1 comprehensive physical exam; no acute findings  Plan: see Orders  & Recommendations

## 2013-05-21 LAB — VITAMIN D 1,25 DIHYDROXY
Vitamin D 1, 25 (OH)2 Total: 39 pg/mL (ref 18–72)
Vitamin D3 1, 25 (OH)2: 39 pg/mL

## 2013-06-28 ENCOUNTER — Other Ambulatory Visit: Payer: Self-pay | Admitting: Internal Medicine

## 2013-08-27 ENCOUNTER — Encounter: Payer: Self-pay | Admitting: Internal Medicine

## 2013-08-30 ENCOUNTER — Other Ambulatory Visit: Payer: Self-pay | Admitting: Radiology

## 2013-08-31 ENCOUNTER — Other Ambulatory Visit: Payer: Self-pay | Admitting: Radiology

## 2013-08-31 DIAGNOSIS — D0511 Intraductal carcinoma in situ of right breast: Secondary | ICD-10-CM

## 2013-09-01 ENCOUNTER — Telehealth: Payer: Self-pay | Admitting: *Deleted

## 2013-09-01 NOTE — Telephone Encounter (Signed)
Pt called about her appt and I confirmed 09/08/13 [place] PM appt w/ pt.  She was in question about an Network engineer, so I got her set up with Arnette Felts to oversee.  Updated pts paperwork and place on Dawn's desk.  Added to spreadsheet.

## 2013-09-02 ENCOUNTER — Encounter: Payer: Self-pay | Admitting: Internal Medicine

## 2013-09-02 ENCOUNTER — Telehealth: Payer: Self-pay | Admitting: *Deleted

## 2013-09-02 NOTE — Telephone Encounter (Signed)
Confirmed [facility] for 09/08/13 at 1230.  Instructions and contact information given.

## 2013-09-03 ENCOUNTER — Encounter: Payer: Self-pay | Admitting: Internal Medicine

## 2013-09-04 DIAGNOSIS — C50919 Malignant neoplasm of unspecified site of unspecified female breast: Secondary | ICD-10-CM

## 2013-09-04 HISTORY — DX: Malignant neoplasm of unspecified site of unspecified female breast: C50.919

## 2013-09-06 ENCOUNTER — Ambulatory Visit
Admission: RE | Admit: 2013-09-06 | Discharge: 2013-09-06 | Disposition: A | Discharge status: Home / Self Care | Payer: BC Managed Care – PPO | Source: Ambulatory Visit | Attending: Radiology | Admitting: Radiology

## 2013-09-06 DIAGNOSIS — D0511 Intraductal carcinoma in situ of right breast: Secondary | ICD-10-CM

## 2013-09-06 MED ORDER — GADOBENATE DIMEGLUMINE 529 MG/ML IV SOLN
16.0000 mL | Freq: Once | INTRAVENOUS | Status: AC | PRN
  Administered 2013-09-06: 16 mL via INTRAVENOUS

## 2013-09-07 ENCOUNTER — Encounter: Payer: Self-pay | Admitting: Internal Medicine

## 2013-09-08 ENCOUNTER — Encounter: Payer: Self-pay | Admitting: *Deleted

## 2013-09-08 ENCOUNTER — Ambulatory Visit
Admission: RE | Admit: 2013-09-08 | Discharge: 2013-09-08 | Disposition: A | Discharge status: Home / Self Care | Payer: BC Managed Care – PPO | Source: Ambulatory Visit | Attending: Radiation Oncology | Admitting: Radiation Oncology

## 2013-09-08 ENCOUNTER — Ambulatory Visit (HOSPITAL_BASED_OUTPATIENT_CLINIC_OR_DEPARTMENT_OTHER): Payer: BC Managed Care – PPO | Admitting: General Surgery

## 2013-09-08 ENCOUNTER — Encounter: Payer: Self-pay | Admitting: General Practice

## 2013-09-08 VITALS — BP 127/50 | HR 71 | Temp 97.8°F | Resp 18 | Ht 61.0 in | Wt 177.4 lb

## 2013-09-08 DIAGNOSIS — C50911 Malignant neoplasm of unspecified site of right female breast: Secondary | ICD-10-CM

## 2013-09-08 DIAGNOSIS — C50919 Malignant neoplasm of unspecified site of unspecified female breast: Secondary | ICD-10-CM

## 2013-09-08 DIAGNOSIS — C50511 Malignant neoplasm of lower-outer quadrant of right female breast: Secondary | ICD-10-CM | POA: Insufficient documentation

## 2013-09-08 DIAGNOSIS — C50519 Malignant neoplasm of lower-outer quadrant of unspecified female breast: Secondary | ICD-10-CM

## 2013-09-08 NOTE — Assessment & Plan Note (Signed)
Pt has a right-sided high-grade DCIS with necrosis. Because of its unusual ER and PR negative status, the high-grade nature of it, and the discrepancy in size between the MRI and the ultrasound/mammogram, we will do a sentinel lymph node biopsy on her.  I will schedule her for a seed localized lumpectomy with sentinel node. We will do this as soon as mutually convenient.  I discussed the procedure with her including risks and benefits. I discussed the procedure for placing the seeds as well as the sentinel lymph node biopsy. We discussed this as an outpatient operation. I reviewed for the incisions would be. I discussed the risk of bleeding, infection, pain, change in breast contour, seroma, and other cardiac and pulmonary risks. 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  She will then followup to receive radiation. She will also need medical oncology followup. I am not sure if they would recommend hormonal chemoprevention since this cancer is hormone negative. I would leave that to them to review.  45 min spent in examination, evaluation, counseling, and coordination of care.  >50% visit spent in counseling.

## 2013-09-08 NOTE — Progress Notes (Addendum)
Chief complaint:  New right breast cancer  Referring MD: Christene Slates, MD  HISTORY:  Patient is a 63 year old female referred by Dr. Marcelo Baldy for consultation regarding a new right breast cancer.  She presented with screening detected abnormal calcifications. She does have a sister that had breast cancer and a brother with leukemia.  They did have BRCA testing that was negative. 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 This was ER and PR negative. She has undergone MRI since then and had some biopsy change and hematoma that was around 3.7 cm in diameter. She has not palpated a mass. She denies any nipple discharge or skin dimpling. She has been up-to-date with her prior mammograms. She has been on estrogen replacement and the low dose around 5 years. 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  Past Medical History  Diagnosis Date  . RUQ abdominal pain 2005    negative GB scan  . Hyperlipidemia   . Hypertension   . Lichen planus     Dr Leonie Green; [facility]  . Sleep apnea   . IBS (irritable bowel syndrome)     Past Surgical History  Procedure Laterality Date  . Abdominal hysterectomy  1995    For uterine Fibroids, no BSO  . Carpal tunnel release  1991    bilaterally  . Colonoscopy  2003& 2006    NegativeX2 by Dr Olevia Perches. Now seeing Dr Collene Mares  . Vulva /perineum biopsy    . Hemorrhoid surgery      infrared coagulation  . Esophagogastroduodenoscopy  10/11/2003  . Hammer toe surgery  06/25/2011    Dr Joya Gaskins  . Cesarean section      Current Outpatient Prescriptions  Medication Sig Dispense Refill  . aspirin 81 MG tablet Take 81 mg by mouth. 2 by mouth at bedtime      . Calcium Carbonate-Vitamin D (CALCIUM + D PO) Take by mouth. 2 by mouth daily      . cycloSPORINE (RESTASIS) 0.05 % ophthalmic emulsion 1 drop 2 (two) times daily.        Marland Kitchen estrogen-methylTESTOSTERone (ESTRATEST) 1.25-2.5  MG per tablet Take 1 tablet by mouth daily.      . hydrocortisone valerate cream (WESTCORT) 0.2 % Apply 1 application topically as needed.      Marland Kitchen Linaclotide (LINZESS) 290 MCG CAPS Take by mouth daily.      Marland Kitchen losartan-hydrochlorothiazide (HYZAAR) 50-12.5 MG per tablet TAKE 1 TABLET BY MOUTH EVERY DAY  90 tablet  3  . metoprolol (LOPRESSOR) 50 MG tablet Take 1 tablet (50 mg total) by mouth as directed.  90 tablet  1  . Multiple Vitamin (MULTIVITAMIN) tablet Take 1 tablet by mouth daily.        . Omega-3 Fatty Acids (FISH OIL) 1000 MG CAPS Take 1,000 mg by mouth daily.        . polyethylene glycol (MIRALAX / GLYCOLAX) packet Take 17 g by mouth as needed.      . Probiotic Product (PROBIOTIC PO) Take by mouth daily.         No current facility-administered medications for this visit.     No Known Allergies   Family History  Problem Relation Age of Onset  . Heart attack Father 22  . Stroke Mother 44  . Breast cancer Sister   . Pulmonary embolism Sister     DVT in context breast cancer  .  Hypertension Brother   . Leukemia Brother   . Tuberculosis Maternal Uncle   . Diabetes Maternal Aunt   . Other Sister     cerebral amyloid angiopathy  . Pulmonary embolism Daughter     on BCP; non smoker     History   Social History  . Marital Status: Married    Spouse Name: N/A    Number of Children: N/A  . Years of Education: N/A   Social History Main Topics  . Smoking status: Never Smoker   . Smokeless tobacco: Not on file  . Alcohol Use: 1.8 oz/week    3 Glasses of wine per week     Comment: Wine  . Drug Use: No  . Sexual Activity: Not on file   Other Topics Concern  . Not on file   Social History Narrative  . No narrative on file     REVIEW OF SYSTEMS - PERTINENT POSITIVES ONLY: 12 point review of systems negative other than HPI and PMH except for palpitations, foot swelling  EXAM: Wt Readings from Last 3 Encounters:  09/08/13 177 lb 6.4 oz (80.468 kg)  05/10/13 181  lb (82.101 kg)  05/04/12 173 lb (78.472 kg)   Temp Readings from Last 3 Encounters:  09/08/13 97.8 F (36.6 C)   05/10/13 98.5 F (36.9 C) Oral  05/04/12 98.2 F (36.8 C) Oral   BP Readings from Last 3 Encounters:  09/08/13 127/50  05/10/13 128/80  05/04/12 118/70   Pulse Readings from Last 3 Encounters:  09/08/13 71  05/10/13 62  05/04/12 63     Wt Readings from Last 3 Encounters:  09/08/13 177 lb 6.4 oz (80.468 kg)  05/10/13 181 lb (82.101 kg)  05/04/12 173 lb (78.472 kg)     Gen:  No acute distress.  Well nourished and well groomed.   Neurological: Alert and oriented to person, place, and time. Coordination normal.  Head: Normocephalic and atraumatic.  Eyes: Conjunctivae are normal. Pupils are equal, round, and reactive to light. No scleral icterus.  Neck: Normal range of motion. Neck supple. No tracheal deviation or thyromegaly present.  Cardiovascular: Normal rate, regular rhythm, normal heart sounds and intact distal pulses.  Exam reveals no gallop and no friction rub.  No murmur heard. Breast: small hematoma near biopsy site, no palpable mass, nipple discharge, lymphadenopathy.  No skin dimpling.  Left side without masses.  Skin dimpling.   Respiratory: Effort normal.  No respiratory distress. No chest wall tenderness. Breath sounds normal.  No wheezes, rales or rhonchi.  GI: Soft. Bowel sounds are normal. The abdomen is soft and nontender.  There is no rebound and no guarding.  Musculoskeletal: Normal range of motion. Extremities are nontender.  Lymphadenopathy: No cervical, preauricular, postauricular or axillary adenopathy is present Skin: Skin is warm and dry. No rash noted. No diaphoresis. No erythema. No pallor. No clubbing, cyanosis, or edema.   Psychiatric: Normal mood and affect. Behavior is normal. Judgment and thought content normal.    LABORATORY RESULTS: Available labs are reviewed   Pathology Diagnosis Breast, right, needle core biopsy - HIGH  GRADE DUCTAL CARCINOMA IN SITU WITH NECROSIS. RADIOLOGY RESULTS: See E-Chart or I-Site for most recent results.  Images and reports are reviewed.  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  LABS:  BUN and creatinine were obtained on site at [facility] at  [place]  Results:  BUN 17 mg/dL,  Creatinine 1.2 mg/dL.  EXAM: BILATERAL BREAST MRI WITH AND WITHOUT CONTRAST  TECHNIQUE: Multiplanar, multisequence MR images of both breasts were obtained prior to and following the intravenous administration of 24m of MultiHance.  THREE-DIMENSIONAL MR IMAGE RENDERING ON INDEPENDENT WORKSTATION:  Three-dimensional MR images were rendered by post-processing of the original MR data on an independent workstation. The three-dimensional MR images were interpreted, and findings are reported in the following complete MRI report for this study. Three dimensional images were evaluated at the independent DynaCad workstation  COMPARISON:  [facility]  FINDINGS: Breast composition: b.  Scattered fibroglandular tissue.  Background parenchymal enhancement: Moderate  Right breast: In the lateral aspect of the middle third of the right breast are post biopsy changes with a 3.8 cm hematoma. Signal void artifact corresponds with the biopsy clip. There is surrounding enhancement, expected following biopsy. No additional areas of abnormal enhancement are seen.  Left breast: No mass or abnormal enhancement.  Lymph nodes: No abnormal appearing lymph nodes.  Ancillary findings:  None.  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  RECOMMENDATION: Treatment planning of the known right breast ductal carcinoma in-situ is recommended. [place] [place] the calcifications span an area of 3.7 cm. The calcifications can be bracketed prior to surgery if breast conservation is being considered.  BI-RADS CATEGORY  6: Known biopsy-proven malignancy.   Electronically Signed   By: DLillia MountainM.D.   On: 09/06/2013 15:22      ASSESSMENT AND PLAN: Breast cancer of lower-outer quadrant of right female breast Pt has a right-sided high-grade DCIS with necrosis. Because of its unusual ER and PR negative status, the high-grade nature of it, and the discrepancy in size between the MRI and the ultrasound/mammogram, we will do a sentinel lymph node biopsy on her.  I will schedule her for a seed localized lumpectomy with sentinel node. We will do this as soon as mutually convenient.  I discussed the procedure with her including risks and benefits. I discussed the procedure for placing the seeds as well as the sentinel lymph node biopsy. We discussed this as an outpatient operation. I reviewed for the incisions would be. I discussed the risk of bleeding, infection, pain, change in breast contour, seroma, and other cardiac and pulmonary risks. She is a CImmunologistand understands risks of anesthesia very well.  She will then followup to receive radiation. She will also need medical oncology followup. I am not sure if they would recommend hormonal chemoprevention since this cancer is hormone negative. I would leave that to them to review.  45 min spent in examination, evaluation, counseling, and coordination of care.  >50% visit spent in counseling.       FMilus HeightMD Surgical Oncology, [facility]      Visit Diagnoses: 1. Breast cancer, right   2. Breast cancer of lower-outer quadrant of right female breast     Primary Care Physician: WUnice Cobble MD  Other care team RArloa Koh MD

## 2013-09-08 NOTE — Progress Notes (Signed)
[facility] NEW PATIENT EVALUATION  Name: Cynthia Wagner MRN: [id number]  Date:   09/08/2013           DOB: [birth year]  Status: outpatient   CC: Cynthia Cobble, MD  Stark Klein, MD    REFERRING PHYSICIAN: Stark Klein, MD   DIAGNOSIS: Stage 0 (Tis N0 M0) high-grade DCIS of the right breast   HISTORY OF PRESENT ILLNESS:  Cynthia Wagner is a 63 y.o. female who is seen today at the [facility] through the courtesy of Dr. Barry Dienes for evaluation of her high-grade DCIS of the right breast. At the time of a screening mammogram at [facility] on July 20 she was found to have a 1.6 cm area of suspicious calcifications at 7:00. A biopsy on July 27 was diagnostic for high-grade DCIS with necrosis. She was ER/PR negative. Breast MR on 09/06/2013 showed biopsy changes along the lateral aspect of the right breast a. Of note is that her sister was diagnosed with breast cancer but died from pulmonary emboli at age 45 before any therapy. The patient underwent BRCA testing in 2013 and she was found to be negative. She is seen today with Dr. Barry Dienes. Of note is that she has been on estrogen replacement therapy for approximately 5 years.  PREVIOUS RADIATION THERAPY: No   PAST MEDICAL HISTORY:  has a past medical history of RUQ abdominal pain (2005); Hyperlipidemia; Hypertension; Lichen planus; Sleep apnea; and IBS (irritable bowel syndrome).     PAST SURGICAL HISTORY:  Past Surgical History  Procedure Laterality Date  . Abdominal hysterectomy  1995    For uterine Fibroids, no BSO  . Carpal tunnel release  1991    bilaterally  . Colonoscopy  2003& 2006    NegativeX2 by Dr Olevia Perches. Now seeing Dr Collene Mares  . Vulva /perineum biopsy    . Hemorrhoid surgery      infrared coagulation  . Esophagogastroduodenoscopy  10/11/2003  . Hammer toe surgery  06/25/2011    Dr Joya Gaskins  . Cesarean section       FAMILY HISTORY: family history includes Breast cancer in her sister; Diabetes in her maternal  aunt; Heart attack (age of onset: 7) in her father; Hypertension in her brother; Leukemia in her brother; Other in her sister; Pulmonary embolism in her daughter and sister; Stroke (age of onset: 40) in her mother; Tuberculosis in her maternal uncle.   SOCIAL HISTORY:  reports that she has never smoked. She does not have any smokeless tobacco history on file. She reports that she drinks about 1.8 ounces of alcohol per week. She reports that she does not use illicit drugs. Married, 3 children. She works as a Music therapist.   ALLERGIES: Review of patient's allergies indicates no known allergies.   MEDICATIONS:  Current Outpatient Prescriptions  Medication Sig Dispense Refill  . aspirin 81 MG tablet Take 81 mg by mouth. 2 by mouth at bedtime      . Calcium Carbonate-Vitamin D (CALCIUM + D PO) Take by mouth. 2 by mouth daily      . cycloSPORINE (RESTASIS) 0.05 % ophthalmic emulsion 1 drop 2 (two) times daily.        Marland Kitchen estrogen-methylTESTOSTERone (ESTRATEST) 1.25-2.5 MG per tablet Take 1 tablet by mouth daily.      . hydrocortisone valerate cream (WESTCORT) 0.2 % Apply 1 application topically as needed.      Marland Kitchen Linaclotide (LINZESS) 290 MCG CAPS Take by mouth daily.      Marland Kitchen  losartan-hydrochlorothiazide (HYZAAR) 50-12.5 MG per tablet TAKE 1 TABLET BY MOUTH EVERY DAY  90 tablet  3  . metoprolol (LOPRESSOR) 50 MG tablet Take 1 tablet (50 mg total) by mouth as directed.  90 tablet  1  . Multiple Vitamin (MULTIVITAMIN) tablet Take 1 tablet by mouth daily.        . Omega-3 Fatty Acids (FISH OIL) 1000 MG CAPS Take 1,000 mg by mouth daily.        . polyethylene glycol (MIRALAX / GLYCOLAX) packet Take 17 g by mouth as needed.      . Probiotic Product (PROBIOTIC PO) Take by mouth daily.         No current facility-administered medications for this encounter.     REVIEW OF SYSTEMS:  Pertinent items are noted in HPI.    PHYSICAL EXAM:  height is 5' 1" (1.549 m) and weight is 177 lb 6.4 oz (80.468  kg). Her temperature is 97.8 F (36.6 C). Her blood pressure is 127/50 and her pulse is 71. Her respiration is 18.   Alert oriented 63 year old [place] American female appearing younger than her stated age. Head and neck examination: Grossly unremarkable. Nodes: Without palpable cervical, supraclavicular, or axillary lymphadenopathy. Chest: Lungs clear. Breasts: There is a biopsy wound at 7:00 along the lower outer quadrant of the right breast with a small hematoma. No other masses are appreciated. Left breast without masses or lesions. Extremities: Without edema.   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 We discussed local treatment options which include mastectomy versus partial mastectomy followed by radiation therapy. She is interested in breast preservation. We talked about possible hypo-fractionated radiation therapy depending on her dose distribution. We discussed the potential acute and late toxicities of radiation therapy.   PLAN: As discussed above. I can see the patient postoperatively.  I spent 30 minutes minutes face to face with the patient and more than 50% of that time was spent in counseling and/or coordination of care.

## 2013-09-08 NOTE — Progress Notes (Signed)
Visited with [name] and her husband at [facility].  Per pt, she has been facing her mortality and dealing with grief for some time because of deaths of her sisters (one sister died in 2022/02/28; another died in 29 at the age of 30).  She uses gratitude, faith, and perspective to cope, noting that "everything is relative"--that is, she has encountered bigger stressors than new dx.  Work is a stressor because she has a small work group and wants to honor her colleagues in midst of schedule challenges.  Per pt, constipation may be associated with hx IBS, L foot swelling from hx surgery.  [place], [place]

## 2013-09-08 NOTE — Patient Instructions (Signed)
IF YOU ARE TAKING ASPIRIN, COUMADIN/WARFARIN, PLAVIX, OR OTHER BLOOD THINNER, PLEASE LET us KNOW IMMEDIATELY.  WE WILL NEED TO DISCUSS WITH THE PRESCRIBING PROVIDER IF THESE ARE SAFE TO STOP. IF THESE ARE NOT STOPPED AT THE APPROPRIATE TIME, THIS WILL RESULT IN A DELAY FOR YOUR SURGERY.  DO NOT TAKE THESE MEDICATIONS OR IBUPROFEN/NAPROXEN WITHIN A WEEK BEFORE SURGERY.   The main risks of surgery are bleeding, infection, damage to other structures, and seroma (accumulation of fluid) under the incision site(s).    These complications may lead to additional procedures such as drainage of seroma/infection.  Depending on final pathology findings, you may need other surgeries to obtain negative margins or to take more lymph nodes.   Most women do accumulate fluid in the breast cavity where the specimen was removed. We do not always have to drain this fluid.  If your breast is very tense, painful, or red, then we may need to numb the skin and use a needle to aspirate the fluid.  We do provide patients with a Breast Binder.  The purpose of this is to avoid the use of tape on the sensitive tissue of the breast and to provide some compression to minimize the risk of seroma.  If the binder is uncomfortable, you may find that a tank top with a built-in shelf bra or a loose sports bra works better for you.  I recommend wearing this around the clock for the first 1-2 weeks except in the shower.    You may remove your dressings and may shower 48 hours after surgery IF YOU DO NOT HAVE A DRAIN.    Many patients have some constipation in the week after surgery due to the narcotics and anesthesia.  You may need over the counter stool softeners or laxatives if you experience difficulty having bowel movements.    If the following occur, call our office at [phone]: If you have a fever over 101 or pain that is severe despite narcotics. If you have redness or drainage at the wound. If you develop persistent nausea  or vomiting.  I will follow you back up in 1-4 weeks.    Please submit any paperwork about time off work/insurance forms to the front desk.

## 2013-09-09 ENCOUNTER — Encounter (HOSPITAL_BASED_OUTPATIENT_CLINIC_OR_DEPARTMENT_OTHER): Payer: Self-pay | Admitting: *Deleted

## 2013-09-09 ENCOUNTER — Encounter: Payer: Self-pay | Admitting: Internal Medicine

## 2013-09-09 ENCOUNTER — Encounter (HOSPITAL_BASED_OUTPATIENT_CLINIC_OR_DEPARTMENT_OTHER)
Admission: RE | Admit: 2013-09-09 | Discharge: 2013-09-09 | Disposition: A | Discharge status: Home / Self Care | Payer: BC Managed Care – PPO | Source: Ambulatory Visit | Attending: General Surgery | Admitting: General Surgery

## 2013-09-09 DIAGNOSIS — Z01812 Encounter for preprocedural laboratory examination: Secondary | ICD-10-CM | POA: Insufficient documentation

## 2013-09-09 DIAGNOSIS — C50919 Malignant neoplasm of unspecified site of unspecified female breast: Secondary | ICD-10-CM | POA: Diagnosis present

## 2013-09-09 DIAGNOSIS — K219 Gastro-esophageal reflux disease without esophagitis: Secondary | ICD-10-CM | POA: Diagnosis not present

## 2013-09-09 DIAGNOSIS — Z01810 Encounter for preprocedural cardiovascular examination: Secondary | ICD-10-CM | POA: Insufficient documentation

## 2013-09-09 DIAGNOSIS — I10 Essential (primary) hypertension: Secondary | ICD-10-CM | POA: Diagnosis not present

## 2013-09-09 DIAGNOSIS — Z7982 Long term (current) use of aspirin: Secondary | ICD-10-CM | POA: Diagnosis not present

## 2013-09-09 DIAGNOSIS — E785 Hyperlipidemia, unspecified: Secondary | ICD-10-CM | POA: Diagnosis not present

## 2013-09-09 DIAGNOSIS — Z79899 Other long term (current) drug therapy: Secondary | ICD-10-CM | POA: Diagnosis not present

## 2013-09-09 DIAGNOSIS — G4730 Sleep apnea, unspecified: Secondary | ICD-10-CM | POA: Diagnosis not present

## 2013-09-09 DIAGNOSIS — K589 Irritable bowel syndrome without diarrhea: Secondary | ICD-10-CM | POA: Diagnosis not present

## 2013-09-09 DIAGNOSIS — D0590 Unspecified type of carcinoma in situ of unspecified breast: Secondary | ICD-10-CM | POA: Diagnosis not present

## 2013-09-09 LAB — BASIC METABOLIC PANEL
Anion gap: 9 (ref 5–15)
BUN: 19 mg/dL (ref 6–23)
CHLORIDE: 104 meq/L (ref 96–112)
CO2: 29 meq/L (ref 19–32)
CREATININE: 1.01 mg/dL (ref 0.50–1.10)
Calcium: 9.7 mg/dL (ref 8.4–10.5)
GFR calc Af Amer: 68 mL/min — ABNORMAL LOW (ref >90)
GFR calc non Af Amer: 58 mL/min — ABNORMAL LOW (ref >90)
GLUCOSE: 74 mg/dL (ref 70–99)
POTASSIUM: 4.2 meq/L (ref 3.7–5.3)
Sodium: 142 mEq/L (ref 137–147)

## 2013-09-13 ENCOUNTER — Encounter: Payer: Self-pay | Admitting: *Deleted

## 2013-09-13 NOTE — Progress Notes (Signed)
[place] Psychosocial Distress Screening Clinical Social Work  Patient was seen by [employer] as noted on 09/08/13.  Patient's distress was assessed and appropriate interventions were provided.     ONCBCN DISTRESS SCREENING 09/08/2013  [place] the number that describes how much distress you have been experiencing in the past week 5  Practical problem type Work/school  Emotional problem type Adjusting to illness  Spiritual/Religous concerns type Facing my mortality  Physical Problem type Constipation/diarrhea;Skin dry/itchy;Swollen arms/legs;Other (comment)  Physician notified of physical symptoms Yes  Referral to clinical psychology No  Referral to clinical social work Yes  Referral to dietition No  Referral to financial advocate No  Referral to support programs Yes  Referral to palliative care No   Johnnye Lana, MSW, [place] [phone]

## 2013-09-14 ENCOUNTER — Telehealth: Payer: Self-pay | Admitting: *Deleted

## 2013-09-14 ENCOUNTER — Encounter (HOSPITAL_BASED_OUTPATIENT_CLINIC_OR_DEPARTMENT_OTHER)
Admission: RE | Disposition: A | Discharge status: Home / Self Care | Payer: Self-pay | Source: Ambulatory Visit | Attending: General Surgery

## 2013-09-14 ENCOUNTER — Encounter (HOSPITAL_BASED_OUTPATIENT_CLINIC_OR_DEPARTMENT_OTHER): Payer: BC Managed Care – PPO | Admitting: Anesthesiology

## 2013-09-14 ENCOUNTER — Ambulatory Visit (HOSPITAL_BASED_OUTPATIENT_CLINIC_OR_DEPARTMENT_OTHER): Payer: BC Managed Care – PPO | Admitting: Anesthesiology

## 2013-09-14 ENCOUNTER — Encounter (HOSPITAL_COMMUNITY)
Admission: RE | Admit: 2013-09-14 | Discharge: 2013-09-14 | Disposition: A | Discharge status: Home / Self Care | Payer: BC Managed Care – PPO | Source: Ambulatory Visit | Attending: General Surgery | Admitting: General Surgery

## 2013-09-14 ENCOUNTER — Encounter (HOSPITAL_BASED_OUTPATIENT_CLINIC_OR_DEPARTMENT_OTHER): Payer: Self-pay | Admitting: *Deleted

## 2013-09-14 ENCOUNTER — Ambulatory Visit (HOSPITAL_BASED_OUTPATIENT_CLINIC_OR_DEPARTMENT_OTHER)
Admission: RE | Admit: 2013-09-14 | Discharge: 2013-09-14 | Disposition: A | Discharge status: Home / Self Care | Payer: BC Managed Care – PPO | Source: Ambulatory Visit | Attending: General Surgery | Admitting: General Surgery

## 2013-09-14 DIAGNOSIS — D0590 Unspecified type of carcinoma in situ of unspecified breast: Secondary | ICD-10-CM | POA: Diagnosis not present

## 2013-09-14 DIAGNOSIS — C50519 Malignant neoplasm of lower-outer quadrant of unspecified female breast: Secondary | ICD-10-CM | POA: Diagnosis present

## 2013-09-14 DIAGNOSIS — Z7982 Long term (current) use of aspirin: Secondary | ICD-10-CM | POA: Insufficient documentation

## 2013-09-14 DIAGNOSIS — I10 Essential (primary) hypertension: Secondary | ICD-10-CM | POA: Insufficient documentation

## 2013-09-14 DIAGNOSIS — Z79899 Other long term (current) drug therapy: Secondary | ICD-10-CM | POA: Insufficient documentation

## 2013-09-14 DIAGNOSIS — E785 Hyperlipidemia, unspecified: Secondary | ICD-10-CM | POA: Insufficient documentation

## 2013-09-14 DIAGNOSIS — G4730 Sleep apnea, unspecified: Secondary | ICD-10-CM | POA: Insufficient documentation

## 2013-09-14 DIAGNOSIS — K589 Irritable bowel syndrome without diarrhea: Secondary | ICD-10-CM | POA: Insufficient documentation

## 2013-09-14 DIAGNOSIS — C50911 Malignant neoplasm of unspecified site of right female breast: Secondary | ICD-10-CM

## 2013-09-14 DIAGNOSIS — K219 Gastro-esophageal reflux disease without esophagitis: Secondary | ICD-10-CM | POA: Insufficient documentation

## 2013-09-14 HISTORY — DX: Scoliosis, unspecified: M41.9

## 2013-09-14 HISTORY — PX: BREAST LUMPECTOMY WITH NEEDLE LOCALIZATION AND AXILLARY SENTINEL LYMPH NODE BX: SHX5760

## 2013-09-14 HISTORY — DX: Malignant neoplasm of unspecified site of unspecified female breast: C50.919

## 2013-09-14 HISTORY — DX: Other specified postprocedural states: Z98.890

## 2013-09-14 HISTORY — DX: Other complications of anesthesia, initial encounter: T88.59XA

## 2013-09-14 HISTORY — DX: Adverse effect of unspecified anesthetic, initial encounter: T41.45XA

## 2013-09-14 HISTORY — DX: Other specified postprocedural states: R11.2

## 2013-09-14 HISTORY — DX: Presence of dental prosthetic device (complete) (partial): Z97.2

## 2013-09-14 LAB — POCT HEMOGLOBIN-HEMACUE: Hemoglobin: 12.8 g/dL (ref 12.0–15.0)

## 2013-09-14 SURGERY — BREAST LUMPECTOMY WITH NEEDLE LOCALIZATION AND AXILLARY SENTINEL LYMPH NODE BX
Anesthesia: Regional | Site: Breast | Laterality: Right

## 2013-09-14 MED ORDER — OXYCODONE HCL 5 MG/5ML PO SOLN: 5.0000 mg | Freq: Once | ORAL | Status: DC | PRN

## 2013-09-14 MED ORDER — DEXAMETHASONE SODIUM PHOSPHATE 4 MG/ML IJ SOLN
INTRAMUSCULAR | Status: DC | PRN
  Administered 2013-09-14: 10 mg via INTRAVENOUS

## 2013-09-14 MED ORDER — MIDAZOLAM HCL 2 MG/2ML IJ SOLN
INTRAMUSCULAR | Status: AC
  Filled 2013-09-14: qty 2

## 2013-09-14 MED ORDER — CEFAZOLIN SODIUM-DEXTROSE 2-3 GM-% IV SOLR
2.0000 g | INTRAVENOUS | Status: AC
  Administered 2013-09-14: 2 g via INTRAVENOUS

## 2013-09-14 MED ORDER — ONDANSETRON HCL 4 MG/2ML IJ SOLN
INTRAMUSCULAR | Status: DC | PRN
  Administered 2013-09-14: 4 mg via INTRAVENOUS

## 2013-09-14 MED ORDER — FENTANYL CITRATE 0.05 MG/ML IJ SOLN
50.0000 ug | INTRAMUSCULAR | Status: DC | PRN
  Administered 2013-09-14: 25 ug via INTRAVENOUS

## 2013-09-14 MED ORDER — PROPOFOL INFUSION 10 MG/ML OPTIME
INTRAVENOUS | Status: DC | PRN
  Administered 2013-09-14: 12.5 ug/kg/min via INTRAVENOUS

## 2013-09-14 MED ORDER — PROPOFOL 10 MG/ML IV BOLUS
INTRAVENOUS | Status: DC | PRN
  Administered 2013-09-14: 150 mg via INTRAVENOUS

## 2013-09-14 MED ORDER — FENTANYL CITRATE 0.05 MG/ML IJ SOLN
INTRAMUSCULAR | Status: DC | PRN
  Administered 2013-09-14: 50 ug via INTRAVENOUS

## 2013-09-14 MED ORDER — CEFAZOLIN SODIUM-DEXTROSE 2-3 GM-% IV SOLR
INTRAVENOUS | Status: AC
  Filled 2013-09-14: qty 50

## 2013-09-14 MED ORDER — HYDROMORPHONE HCL PF 1 MG/ML IJ SOLN: 0.2500 mg | INTRAMUSCULAR | Status: DC | PRN

## 2013-09-14 MED ORDER — OXYCODONE HCL 5 MG PO TABS: 5.0000 mg | ORAL_TABLET | Freq: Once | ORAL | Status: DC | PRN

## 2013-09-14 MED ORDER — SCOPOLAMINE 1 MG/3DAYS TD PT72
1.0000 | MEDICATED_PATCH | Freq: Once | TRANSDERMAL | Status: AC
  Administered 2013-09-14: 1.5 mg via TRANSDERMAL
  Administered 2013-09-14: 1 via TRANSDERMAL

## 2013-09-14 MED ORDER — MIDAZOLAM HCL 2 MG/ML PO SYRP: 0.5000 mg/kg | ORAL_SOLUTION | Freq: Once | ORAL | Status: DC | PRN

## 2013-09-14 MED ORDER — FENTANYL CITRATE 0.05 MG/ML IJ SOLN
INTRAMUSCULAR | Status: AC
  Filled 2013-09-14: qty 2

## 2013-09-14 MED ORDER — LACTATED RINGERS IV SOLN
INTRAVENOUS | Status: DC
  Administered 2013-09-14 (×2): via INTRAVENOUS

## 2013-09-14 MED ORDER — LIDOCAINE HCL (CARDIAC) 20 MG/ML IV SOLN
INTRAVENOUS | Status: DC | PRN
  Administered 2013-09-14: 50 mg via INTRAVENOUS

## 2013-09-14 MED ORDER — KETOROLAC TROMETHAMINE 30 MG/ML IJ SOLN: 30.0000 mg | Freq: Three times a day (TID) | INTRAMUSCULAR | Status: DC | PRN

## 2013-09-14 MED ORDER — SODIUM CHLORIDE 0.9 % IJ SOLN
INTRAMUSCULAR | Status: DC | PRN
  Administered 2013-09-14: 10:00:00 via INTRAMUSCULAR

## 2013-09-14 MED ORDER — MIDAZOLAM HCL 2 MG/2ML IJ SOLN
1.0000 mg | INTRAMUSCULAR | Status: DC | PRN
  Administered 2013-09-14: 0.5 mg via INTRAVENOUS

## 2013-09-14 MED ORDER — MIDAZOLAM HCL 5 MG/5ML IJ SOLN
INTRAMUSCULAR | Status: DC | PRN
  Administered 2013-09-14: 1 mg via INTRAVENOUS

## 2013-09-14 MED ORDER — FENTANYL CITRATE 0.05 MG/ML IJ SOLN
INTRAMUSCULAR | Status: AC
  Filled 2013-09-14: qty 6

## 2013-09-14 MED ORDER — SCOPOLAMINE 1 MG/3DAYS TD PT72
MEDICATED_PATCH | TRANSDERMAL | Status: AC
  Filled 2013-09-14: qty 1

## 2013-09-14 MED ORDER — ROPIVACAINE HCL 5 MG/ML IJ SOLN
INTRAMUSCULAR | Status: DC | PRN
  Administered 2013-09-14: 30 mL

## 2013-09-14 MED ORDER — TRAMADOL HCL 50 MG PO TABS: 50.0000 mg | ORAL_TABLET | Freq: Four times a day (QID) | ORAL | Status: DC | PRN

## 2013-09-14 MED ORDER — LIDOCAINE-EPINEPHRINE (PF) 1 %-1:200000 IJ SOLN
INTRAMUSCULAR | Status: DC | PRN
  Administered 2013-09-14: 11:00:00

## 2013-09-14 SURGICAL SUPPLY — 74 items
ADH SKN CLS APL DERMABOND .7 (GAUZE/BANDAGES/DRESSINGS) ×1
APL SKNCLS STERI-STRIP NONHPOA (GAUZE/BANDAGES/DRESSINGS)
APPLIER CLIP 9.375 MED OPEN (MISCELLANEOUS) ×2
APR CLP MED 9.3 20 MLT OPN (MISCELLANEOUS) ×1
BENZOIN TINCTURE PRP APPL 2/3 (GAUZE/BANDAGES/DRESSINGS) ×1 IMPLANT
BINDER BREAST LRG (GAUZE/BANDAGES/DRESSINGS) IMPLANT
BINDER BREAST MEDIUM (GAUZE/BANDAGES/DRESSINGS) IMPLANT
BINDER BREAST XLRG (GAUZE/BANDAGES/DRESSINGS) ×1 IMPLANT
BINDER BREAST XXLRG (GAUZE/BANDAGES/DRESSINGS) IMPLANT
BLADE HEX COATED 2.75 (ELECTRODE) ×1 IMPLANT
BLADE SURG 10 STRL SS (BLADE) ×1 IMPLANT
BLADE SURG 15 STRL LF DISP TIS (BLADE) ×1 IMPLANT
BLADE SURG 15 STRL SS (BLADE) ×2
BNDG COHESIVE 4X5 TAN STRL (GAUZE/BANDAGES/DRESSINGS) ×1 IMPLANT
CANISTER SUC SOCK COL 7IN (MISCELLANEOUS) IMPLANT
CANISTER SUCT 1200ML W/VALVE (MISCELLANEOUS) IMPLANT
CHLORAPREP W/TINT 26ML (MISCELLANEOUS) ×2 IMPLANT
CLIP APPLIE 9.375 MED OPEN (MISCELLANEOUS) ×1 IMPLANT
CLIP TI LARGE 6 (CLIP) ×1 IMPLANT
CLIP TI MEDIUM 6 (CLIP) ×4 IMPLANT
CLIP TI WIDE RED SMALL 6 (CLIP) ×1 IMPLANT
COVER MAYO STAND STRL (DRAPES) ×2 IMPLANT
COVER PROBE W GEL 5X96 (DRAPES) ×2 IMPLANT
COVER TABLE BACK 60X90 (DRAPES) ×2 IMPLANT
DECANTER SPIKE VIAL GLASS SM (MISCELLANEOUS) IMPLANT
DERMABOND ADVANCED (GAUZE/BANDAGES/DRESSINGS) ×1
DERMABOND ADVANCED .7 DNX12 (GAUZE/BANDAGES/DRESSINGS) ×1 IMPLANT
DEVICE DUBIN W/COMP PLATE 8390 (MISCELLANEOUS) ×2 IMPLANT
DRAPE LAPAROSCOPIC ABDOMINAL (DRAPES) ×2 IMPLANT
DRAPE UTILITY XL STRL (DRAPES) ×1 IMPLANT
DRSG TEGADERM 4X4.75 (GAUZE/BANDAGES/DRESSINGS) ×2 IMPLANT
ELECT COATED BLADE 2.86 ST (ELECTRODE) ×1 IMPLANT
ELECT REM PT RETURN 9FT ADLT (ELECTROSURGICAL) ×2
ELECTRODE REM PT RTRN 9FT ADLT (ELECTROSURGICAL) ×1 IMPLANT
GLOVE BIO SURGEON STRL SZ 6 (GLOVE) ×3 IMPLANT
GLOVE BIOGEL PI IND STRL 6.5 (GLOVE) ×1 IMPLANT
GLOVE BIOGEL PI IND STRL 7.0 (GLOVE) IMPLANT
GLOVE BIOGEL PI INDICATOR 6.5 (GLOVE) ×2
GLOVE BIOGEL PI INDICATOR 7.0 (GLOVE) ×1
GLOVE ECLIPSE 6.5 STRL STRAW (GLOVE) ×1 IMPLANT
GLOVE EXAM NITRILE MD LF STRL (GLOVE) ×1 IMPLANT
GOWN STRL REUS W/ TWL LRG LVL3 (GOWN DISPOSABLE) ×2 IMPLANT
GOWN STRL REUS W/ TWL XL LVL3 (GOWN DISPOSABLE) IMPLANT
GOWN STRL REUS W/TWL LRG LVL3 (GOWN DISPOSABLE) ×2
GOWN STRL REUS W/TWL XL LVL3 (GOWN DISPOSABLE) ×2
KIT MARKER MARGIN INK (KITS) ×2 IMPLANT
NDL HYPO 25X1 1.5 SAFETY (NEEDLE) ×1 IMPLANT
NEEDLE HYPO 25X1 1.5 SAFETY (NEEDLE) ×2 IMPLANT
NS IRRIG 1000ML POUR BTL (IV SOLUTION) ×1 IMPLANT
PACK BASIN DAY SURGERY FS (CUSTOM PROCEDURE TRAY) ×2 IMPLANT
PACK UNIVERSAL I (CUSTOM PROCEDURE TRAY) ×1 IMPLANT
PENCIL BUTTON HOLSTER BLD 10FT (ELECTRODE) ×2 IMPLANT
SHEET MEDIUM DRAPE 40X70 STRL (DRAPES) IMPLANT
SLEEVE SCD COMPRESS KNEE MED (MISCELLANEOUS) ×2 IMPLANT
SPONGE GAUZE 4X4 12PLY STER LF (GAUZE/BANDAGES/DRESSINGS) ×2 IMPLANT
SPONGE LAP 18X18 X RAY DECT (DISPOSABLE) ×1 IMPLANT
SPONGE LAP 4X18 X RAY DECT (DISPOSABLE) ×1 IMPLANT
STAPLER VISISTAT 35W (STAPLE) ×2 IMPLANT
STOCKINETTE IMPERVIOUS LG (DRAPES) ×1 IMPLANT
STRIP CLOSURE SKIN 1/2X4 (GAUZE/BANDAGES/DRESSINGS) ×2 IMPLANT
SUT ETHILON 2 0 FS 18 (SUTURE) IMPLANT
SUT MNCRL AB 4-0 PS2 18 (SUTURE) ×2 IMPLANT
SUT MON AB 5-0 PS2 18 (SUTURE) IMPLANT
SUT SILK 2 0 SH (SUTURE) IMPLANT
SUT VIC AB 2-0 SH 27 (SUTURE) ×2
SUT VIC AB 2-0 SH 27XBRD (SUTURE) ×1 IMPLANT
SUT VIC AB 3-0 SH 27 (SUTURE) ×4
SUT VIC AB 3-0 SH 27X BRD (SUTURE) ×1 IMPLANT
SUT VIC AB 5-0 PS2 18 (SUTURE) IMPLANT
SYR CONTROL 10ML LL (SYRINGE) ×2 IMPLANT
TOWEL OR 17X24 6PK STRL BLUE (TOWEL DISPOSABLE) ×2 IMPLANT
TOWEL OR NON WOVEN STRL DISP B (DISPOSABLE) ×2 IMPLANT
TUBE CONNECTING 20X1/4 (TUBING) ×2 IMPLANT
YANKAUER SUCT BULB TIP NO VENT (SUCTIONS) ×1 IMPLANT

## 2013-09-14 NOTE — Progress Notes (Signed)
Emotional support during breast injections °

## 2013-09-14 NOTE — Discharge Instructions (Addendum)
[place] Office Phone Number [phone]  BREAST BIOPSY/ PARTIAL MASTECTOMY: POST OP INSTRUCTIONS  Always review your discharge instruction sheet given to you by the facility where your surgery was performed.  IF YOU HAVE DISABILITY OR FAMILY LEAVE FORMS, YOU MUST BRING THEM TO THE OFFICE FOR PROCESSING.  DO NOT GIVE THEM TO YOUR DOCTOR.  1. A prescription for pain medication may be given to you upon discharge.  Take your pain medication as prescribed, if needed.  If narcotic pain medicine is not needed, then you may take acetaminophen (Tylenol) or ibuprofen (Advil) as needed. 2. Take your usually prescribed medications unless otherwise directed 3. If you need a refill on your pain medication, please contact your pharmacy.  They will contact our office to request authorization.  Prescriptions will not be filled after 5pm or on week-ends. 4. You should eat very light the first 24 hours after surgery, such as soup, crackers, pudding, etc.  Resume your normal diet the day after surgery. 5. Most patients will experience some swelling and bruising in the breast.  Ice packs and a good support bra will help.  Swelling and bruising can take several days to resolve.  6. It is common to experience some constipation if taking pain medication after surgery.  Increasing fluid intake and taking a stool softener will usually help or prevent this problem from occurring.  A mild laxative (Milk of Magnesia or Miralax) should be taken according to package directions if there are no bowel movements after 48 hours. 7. Unless discharge instructions indicate otherwise, you may remove your bandages 48 hours after surgery, and you may shower at that time.  You may have steri-strips (small skin tapes) in place directly over the incision.  These strips should be left on the skin for 7-10 days.   Any sutures or staples will be removed at the office during your follow-up visit. 8. ACTIVITIES:  You may resume  regular daily activities (gradually increasing) beginning the next day.  Wearing a good support bra or sports bra (or the breast binder) minimizes pain and swelling.  You may have sexual intercourse when it is comfortable. a. You may drive when you no longer are taking prescription pain medication, you can comfortably wear a seatbelt, and you can safely maneuver your car and apply brakes. b. RETURN TO WORK:  __________2 week_______________ 9. You should see your doctor in the office for a follow-up appointment approximately two weeks after your surgery.  Your doctors nurse will typically make your follow-up appointment when she calls you with your pathology report.  Expect your pathology report 2-3 business days after your surgery.  You may call to check if you do not hear from [place] after three days.   WHEN TO CALL YOUR DOCTOR: 1. Fever over 101.0 2. Nausea and/or vomiting. 3. Extreme swelling or bruising. 4. Continued bleeding from incision. 5. Increased pain, redness, or drainage from the incision.  The clinic staff is available to answer your questions during regular business hours.  Please dont hesitate to call and ask to speak to one of the nurses for clinical concerns.  If you have a medical emergency, go to the nearest emergency room or call 911.  A surgeon from [facility] is always on call at the hospital.  For further questions, please visit [email or web address]     Post Anesthesia Home Care Instructions  Activity: Get plenty of rest for the remainder of the day. A responsible adult should stay with you for  24 hours following the procedure.  For the next 24 hours, DO NOT: -Drive a car -Paediatric nurse -Drink alcoholic beverages -Take any medication unless instructed by your physician -Make any legal decisions or sign important papers.  Meals: Start with liquid foods such as gelatin or soup. Progress to regular foods as tolerated. Avoid greasy, spicy,  heavy foods. If nausea and/or vomiting occur, drink only clear liquids until the nausea and/or vomiting subsides. Call your physician if vomiting continues.  Special Instructions/Symptoms: Your throat may feel dry or sore from the anesthesia or the breathing tube placed in your throat during surgery. If this causes discomfort, gargle with warm salt water. The discomfort should disappear within 24 hours.

## 2013-09-14 NOTE — Anesthesia Postprocedure Evaluation (Signed)
  Anesthesia Post-op Note  Patient: Cynthia Wagner  Procedure(s) Performed: Procedure(s): BREAST LUMPECTOMY WITH NEEDLE LOCALIZATION AND AXILLARY SENTINEL LYMPH NODE BX (Right)  Patient Location: PACU  Anesthesia Type:General and block  Level of Consciousness: awake and alert   Airway and Oxygen Therapy: Patient Spontanous Breathing  Post-op Pain: none  Post-op Assessment: Post-op Vital signs reviewed, Patient's Cardiovascular Status Stable and Respiratory Function Stable  Post-op Vital Signs: Reviewed  Filed Vitals:   09/14/13 1206  BP: 125/65  Pulse: 79  Temp: 36.7 C  Resp: 18    Complications: No apparent anesthesia complications

## 2013-09-14 NOTE — Anesthesia Preprocedure Evaluation (Addendum)
Anesthesia Evaluation  Patient identified by MRN, date of birth, ID band Patient awake    Reviewed: Allergy & Precautions, H&P , NPO status , Patient's Chart, lab work & pertinent test results, reviewed documented beta blocker date and time   History of Anesthesia Complications (+) PONV and history of anesthetic complications  Airway Mallampati: II TM Distance: >3 FB Neck ROM: Full    Dental no notable dental hx. (+) Teeth Intact, Dental Advisory Given   Pulmonary neg pulmonary ROS, sleep apnea and Continuous Positive Airway Pressure Ventilation ,  breath sounds clear to auscultation  Pulmonary exam normal       Cardiovascular hypertension, Pt. on medications negative cardio ROS  Rhythm:Regular Rate:Normal     Neuro/Psych negative neurological ROS  negative psych ROS   GI/Hepatic negative GI ROS, Neg liver ROS, GERD-  Controlled,  Endo/Other  negative endocrine ROS  Renal/GU negative Renal ROS  negative genitourinary   Musculoskeletal   Abdominal   Peds  Hematology negative hematology ROS (+)   Anesthesia Other Findings   Reproductive/Obstetrics negative OB ROS                          Anesthesia Physical Anesthesia Plan  ASA: III  Anesthesia Plan: General and Regional   Post-op Pain Management:    Induction: Intravenous  Airway Management Planned: LMA  Additional Equipment:   Intra-op Plan:   Post-operative Plan: Extubation in OR  Informed Consent: I have reviewed the patients History and Physical, chart, labs and discussed the procedure including the risks, benefits and alternatives for the proposed anesthesia with the patient or authorized representative who has indicated his/her understanding and acceptance.   Dental advisory given  Plan Discussed with: CRNA  Anesthesia Plan Comments:         Anesthesia Quick Evaluation

## 2013-09-14 NOTE — H&P (View-Only) (Signed)
Chief complaint:  New right breast cancer  Referring MD: Christene Slates, MD  HISTORY:  Patient is a 63 year old female referred by Dr. Marcelo Baldy for consultation regarding a new right breast cancer.  She presented with screening detected abnormal calcifications. She does have a sister that had breast cancer and a brother with leukemia.  They did have BRCA testing that was negative. 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 This was ER and PR negative. She has undergone MRI since then and had some biopsy change and hematoma that was around 3.7 cm in diameter. She has not palpated a mass. She denies any nipple discharge or skin dimpling. She has been up-to-date with her prior mammograms. She has been on estrogen replacement and the low dose around 5 years. 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  Past Medical History  Diagnosis Date  . RUQ abdominal pain 2005    negative GB scan  . Hyperlipidemia   . Hypertension   . Lichen planus     Dr Leonie Green; [facility]  . Sleep apnea   . IBS (irritable bowel syndrome)     Past Surgical History  Procedure Laterality Date  . Abdominal hysterectomy  1995    For uterine Fibroids, no BSO  . Carpal tunnel release  1991    bilaterally  . Colonoscopy  2003& 2006    NegativeX2 by Dr Olevia Perches. Now seeing Dr Collene Mares  . Vulva /perineum biopsy    . Hemorrhoid surgery      infrared coagulation  . Esophagogastroduodenoscopy  10/11/2003  . Hammer toe surgery  06/25/2011    Dr Joya Gaskins  . Cesarean section      Current Outpatient Prescriptions  Medication Sig Dispense Refill  . aspirin 81 MG tablet Take 81 mg by mouth. 2 by mouth at bedtime      . Calcium Carbonate-Vitamin D (CALCIUM + D PO) Take by mouth. 2 by mouth daily      . cycloSPORINE (RESTASIS) 0.05 % ophthalmic emulsion 1 drop 2 (two) times daily.        Marland Kitchen estrogen-methylTESTOSTERone (ESTRATEST) 1.25-2.5  MG per tablet Take 1 tablet by mouth daily.      . hydrocortisone valerate cream (WESTCORT) 0.2 % Apply 1 application topically as needed.      Marland Kitchen Linaclotide (LINZESS) 290 MCG CAPS Take by mouth daily.      Marland Kitchen losartan-hydrochlorothiazide (HYZAAR) 50-12.5 MG per tablet TAKE 1 TABLET BY MOUTH EVERY DAY  90 tablet  3  . metoprolol (LOPRESSOR) 50 MG tablet Take 1 tablet (50 mg total) by mouth as directed.  90 tablet  1  . Multiple Vitamin (MULTIVITAMIN) tablet Take 1 tablet by mouth daily.        . Omega-3 Fatty Acids (FISH OIL) 1000 MG CAPS Take 1,000 mg by mouth daily.        . polyethylene glycol (MIRALAX / GLYCOLAX) packet Take 17 g by mouth as needed.      . Probiotic Product (PROBIOTIC PO) Take by mouth daily.         No current facility-administered medications for this visit.     No Known Allergies   Family History  Problem Relation Age of Onset  . Heart attack Father 1  . Stroke Mother 54  . Breast cancer Sister   . Pulmonary embolism Sister     DVT in context breast cancer  .  Hypertension Brother   . Leukemia Brother   . Tuberculosis Maternal Uncle   . Diabetes Maternal Aunt   . Other Sister     cerebral amyloid angiopathy  . Pulmonary embolism Daughter     on BCP; non smoker     History   Social History  . Marital Status: Married    Spouse Name: N/A    Number of Children: N/A  . Years of Education: N/A   Social History Main Topics  . Smoking status: Never Smoker   . Smokeless tobacco: Not on file  . Alcohol Use: 1.8 oz/week    3 Glasses of wine per week     Comment: Wine  . Drug Use: No  . Sexual Activity: Not on file   Other Topics Concern  . Not on file   Social History Narrative  . No narrative on file     REVIEW OF SYSTEMS - PERTINENT POSITIVES ONLY: 12 point review of systems negative other than HPI and PMH except for palpitations, foot swelling  EXAM: Wt Readings from Last 3 Encounters:  09/08/13 177 lb 6.4 oz (80.468 kg)  05/10/13 181  lb (82.101 kg)  05/04/12 173 lb (78.472 kg)   Temp Readings from Last 3 Encounters:  09/08/13 97.8 F (36.6 C)   05/10/13 98.5 F (36.9 C) Oral  05/04/12 98.2 F (36.8 C) Oral   BP Readings from Last 3 Encounters:  09/08/13 127/50  05/10/13 128/80  05/04/12 118/70   Pulse Readings from Last 3 Encounters:  09/08/13 71  05/10/13 62  05/04/12 63     Wt Readings from Last 3 Encounters:  09/08/13 177 lb 6.4 oz (80.468 kg)  05/10/13 181 lb (82.101 kg)  05/04/12 173 lb (78.472 kg)     Gen:  No acute distress.  Well nourished and well groomed.   Neurological: Alert and oriented to person, place, and time. Coordination normal.  Head: Normocephalic and atraumatic.  Eyes: Conjunctivae are normal. Pupils are equal, round, and reactive to light. No scleral icterus.  Neck: Normal range of motion. Neck supple. No tracheal deviation or thyromegaly present.  Cardiovascular: Normal rate, regular rhythm, normal heart sounds and intact distal pulses.  Exam reveals no gallop and no friction rub.  No murmur heard. Breast: small hematoma near biopsy site, no palpable mass, nipple discharge, lymphadenopathy.  No skin dimpling.  Left side without masses.  Skin dimpling.   Respiratory: Effort normal.  No respiratory distress. No chest wall tenderness. Breath sounds normal.  No wheezes, rales or rhonchi.  GI: Soft. Bowel sounds are normal. The abdomen is soft and nontender.  There is no rebound and no guarding.  Musculoskeletal: Normal range of motion. Extremities are nontender.  Lymphadenopathy: No cervical, preauricular, postauricular or axillary adenopathy is present Skin: Skin is warm and dry. No rash noted. No diaphoresis. No erythema. No pallor. No clubbing, cyanosis, or edema.   Psychiatric: Normal mood and affect. Behavior is normal. Judgment and thought content normal.    LABORATORY RESULTS: Available labs are reviewed   Pathology Diagnosis Breast, right, needle core biopsy - HIGH  GRADE DUCTAL CARCINOMA IN SITU WITH NECROSIS. RADIOLOGY RESULTS: See E-Chart or I-Site for most recent results.  Images and reports are reviewed.  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  LABS:  BUN and creatinine were obtained on site at [facility] at  [place]  Results:  BUN 17 mg/dL,  Creatinine 1.2 mg/dL.  EXAM: BILATERAL BREAST MRI WITH AND WITHOUT CONTRAST  TECHNIQUE: Multiplanar, multisequence MR images of both breasts were obtained prior to and following the intravenous administration of 8m of MultiHance.  THREE-DIMENSIONAL MR IMAGE RENDERING ON INDEPENDENT WORKSTATION:  Three-dimensional MR images were rendered by post-processing of the original MR data on an independent workstation. The three-dimensional MR images were interpreted, and findings are reported in the following complete MRI report for this study. Three dimensional images were evaluated at the independent DynaCad workstation  COMPARISON:  Previous exams from [facility]  FINDINGS: Breast composition: b.  Scattered fibroglandular tissue.  Background parenchymal enhancement: Moderate  Right breast: In the lateral aspect of the middle third of the right breast are post biopsy changes with a 3.8 cm hematoma. Signal void artifact corresponds with the biopsy clip. There is surrounding enhancement, expected following biopsy. No additional areas of abnormal enhancement are seen.  Left breast: No mass or abnormal enhancement.  Lymph nodes: No abnormal appearing lymph nodes.  Ancillary findings:  None.  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  RECOMMENDATION: Treatment planning of the known right breast ductal carcinoma in-situ is recommended. On the patient's magnified views [facility] 08[facility] the calcifications span an area of 3.7 cm. The calcifications can be bracketed prior to surgery if breast conservation is being considered.  BI-RADS CATEGORY  6: Known biopsy-proven malignancy.   Electronically Signed   By: DLillia MountainM.D.   On: 09/06/2013 15:22      ASSESSMENT AND PLAN: Breast cancer of lower-outer quadrant of right female breast Pt has a right-sided high-grade DCIS with necrosis. Because of its unusual ER and PR negative status, the high-grade nature of it, and the discrepancy in size between the MRI and the ultrasound/mammogram, we will do a sentinel lymph node biopsy on her.  I will schedule her for a seed localized lumpectomy with sentinel node. We will do this as soon as mutually convenient.  I discussed the procedure with her including risks and benefits. I discussed the procedure for placing the seeds as well as the sentinel lymph node biopsy. We discussed this as an outpatient operation. I reviewed for the incisions would be. I discussed the risk of bleeding, infection, pain, change in breast contour, seroma, and other cardiac and pulmonary risks. She is a CImmunologistand understands risks of anesthesia very well.  She will then followup to receive radiation. She will also need medical oncology followup. I am not sure if they would recommend hormonal chemoprevention since this cancer is hormone negative. I would leave that to them to review.  45 min spent in examination, evaluation, counseling, and coordination of care.  >50% visit spent in counseling.       FMilus HeightMD Surgical Oncology, General and [facility]      Visit Diagnoses: 1. Breast cancer, right   2. Breast cancer of lower-outer quadrant of right female breast     Primary Care Physician: WUnice Cobble MD  Other care team RArloa Koh MD

## 2013-09-14 NOTE — Telephone Encounter (Signed)
Spoke with patient from [facility] 09/08/13.  No questions or concerns at this time.  Encouraged patient to call with any needs or concerns.

## 2013-09-14 NOTE — Transfer of Care (Signed)
Immediate Anesthesia Transfer of Care Note  Patient: Cynthia Wagner  Procedure(s) Performed: Procedure(s): BREAST LUMPECTOMY WITH NEEDLE LOCALIZATION AND AXILLARY SENTINEL LYMPH NODE BX (Right)  Patient Location: PACU  Anesthesia Type:General and Regional  Level of Consciousness: awake and alert   Airway & Oxygen Therapy: Patient Spontanous Breathing and Patient connected to face mask oxygen  Post-op Assessment: Report given to PACU RN and Post -op Vital signs reviewed and stable  Post vital signs: Reviewed and stable  Complications: No apparent anesthesia complications

## 2013-09-14 NOTE — Interval H&P Note (Signed)
History and Physical Interval Note:  09/14/2013 9:00 AM  Cynthia Wagner  has presented today for surgery, with the diagnosis of right breast cancer  The various methods of treatment have been discussed with the patient and family. After consideration of risks, benefits and other options for treatment, the patient has consented to  Procedure(s): RADIOACTIVE SEED GUIDED RIGHT BREAST LUMPECTOMY WITH AXILLARY SENTINEL LYMPH NODE BIOPSY (Right) as a surgical intervention .  The patient's history has been reviewed, patient examined, no change in status, stable for surgery.  I have reviewed the patient's chart and labs.  Questions were answered to the patient's satisfaction.     Wing Gfeller

## 2013-09-14 NOTE — Op Note (Signed)
Right needle localized Breast Lumpectomy with Sentinel Node Mapping and Biopsy Procedure Note  Indications: This patient presents with history of right breast cancer with clinically negative axillary lymph node exam.  Pre-operative Diagnosis: right breast cancer  Post-operative Diagnosis: right breast cancer  Surgeon: Stark Klein   Assistants: n/a   Anesthesia: General LMA anesthesia, Local anesthesia 1% plain lidocaine, 0.25.% bupivacaine, with epinephrine and right pectoral block  ASA Class: 3  Procedure Details  The patient was seen in the Holding Room. The risks, benefits, complications, treatment options, and expected outcomes were discussed with the patient. The possibilities of reaction to medication, pulmonary aspiration, bleeding, infection, the need for additional procedures, failure to diagnose a condition, and creating a complication requiring transfusion or operation were discussed with the patient. The patient concurred with the proposed plan, giving informed consent.  The site of surgery properly noted/marked. The patient was taken to Operating Room # 2, identified as Cynthia Wagner and the procedure verified as Breast Lumpectomy and Sentinel Node Biopsy. A Time Out was held and the above information confirmed.  After induction of anesthesia, the right arm, breast, and chest were prepped and draped in standard fashion.  Using a hand-held gamma probe, axillary sentinel nodes were identified transcutaneously.    The lumpectomy was performed by creating an oblique incision over the lower outer quadrant of the breast around the previously placed localization guidewire.  Dissection was carried down to the pectoral fascia, which is the medial margin.  The specimen was inked. Specimen radiography confirmed inclusion of the mammographic lesion.  Hemostasis was achieved with cautery.  The wound was irrigated and closed with a 3-0 Vicryl deep dermal interrupted and a 4-0 Monocryl  subcuticular closure in layers.  An oblique incision was created below the axillary hairline.  Dissection was carried through the clavipectoral fascia.  3 level 2 axillary sentinel nodes were removed and submitted to pathology.  The axillary incision was closed with 3-0 Vicryl deep dermal interrupted suture and 4-0 monocryl subcuticular closure in layers.    Sterile dressings were applied. At the end of the operation, all sponge, instrument, and needle counts were correct.  Findings: grossly clear surgical margins and SLN #1 hot and blue, cps 1100; #2 blue; #3 blue; background 5 cps  Estimated Blood Loss:  Minimal         Specimens: right breast lumpectomy and 3 right axillary sentinel lymph nodes.                  Complications:  None; patient tolerated the procedure well.         Disposition: PACU - hemodynamically stable.         Condition: stable     2

## 2013-09-14 NOTE — Anesthesia Procedure Notes (Addendum)
Anesthesia Regional Block:  Pectoralis block  Pre-Anesthetic Checklist: ,, timeout performed, Correct Patient, Correct Site, Correct Laterality, Correct Procedure, Correct Position, site marked, Risks and benefits discussed, pre-op evaluation, post-op pain management  Laterality: Right  Prep: Maximum Sterile Barrier Precautions used and chloraprep       Needles:  Injection technique: Single-shot  Needle Type: Echogenic Stimulator Needle     Needle Length: 9cm 9 cm Needle Gauge: 21 and 21 G    Additional Needles:  Procedures: ultrasound guided (picture in chart) Pectoralis block Narrative:  Start time: 09/14/2013 9:10 AM End time: 09/14/2013 9:20 AM Injection made incrementally with aspirations every 5 mL. Anesthesiologist: Fitzgerald,MD  Additional Notes: 2% Lidocaine skin wheel.   Procedure Name: LMA Insertion Date/Time: 09/14/2013 9:36 AM Performed by: Melynda Ripple D Pre-anesthesia Checklist: Patient identified, Emergency Drugs available, Suction available and Patient being monitored Patient Re-evaluated:Patient Re-evaluated prior to inductionOxygen Delivery Method: Circle System Utilized Preoxygenation: Pre-oxygenation with 100% oxygen Intubation Type: IV induction Ventilation: Mask ventilation without difficulty LMA: LMA inserted LMA Size: 4.0 Number of attempts: 1 Airway Equipment and Method: bite block Placement Confirmation: positive ETCO2 Tube secured with: Tape Dental Injury: Teeth and Oropharynx as per pre-operative assessment

## 2013-09-14 NOTE — Progress Notes (Signed)
Assisted Dr. Ola Spurr with right, ultrasound guided, pectoralis block. Side rails up, monitors on throughout procedure. See vital signs in flow sheet. Tolerated Procedure well.

## 2013-09-15 ENCOUNTER — Telehealth (INDEPENDENT_AMBULATORY_CARE_PROVIDER_SITE_OTHER): Payer: Self-pay

## 2013-09-15 ENCOUNTER — Encounter (HOSPITAL_BASED_OUTPATIENT_CLINIC_OR_DEPARTMENT_OTHER): Payer: Self-pay | Admitting: General Surgery

## 2013-09-15 NOTE — Telephone Encounter (Signed)
Called pt to inform her per Dr Barry Dienes  there was only the non invasive cancer in the biopsy, all margins are negative, and lymph nodes are negative. Pt verbalized understanding

## 2013-09-15 NOTE — Progress Notes (Signed)
Quick Note:  Please let patient know that there was only the non invasive cancer in the biopsy, all margins are negative, and lymph nodes are negative. ______

## 2013-09-27 ENCOUNTER — Encounter: Payer: Self-pay | Admitting: Internal Medicine

## 2013-09-27 ENCOUNTER — Encounter (INDEPENDENT_AMBULATORY_CARE_PROVIDER_SITE_OTHER): Payer: Self-pay | Admitting: General Surgery

## 2013-09-27 ENCOUNTER — Other Ambulatory Visit: Payer: Self-pay | Admitting: Internal Medicine

## 2013-09-27 ENCOUNTER — Ambulatory Visit (INDEPENDENT_AMBULATORY_CARE_PROVIDER_SITE_OTHER): Payer: BC Managed Care – PPO | Admitting: General Surgery

## 2013-09-27 VITALS — BP 110/70 | HR 66 | Temp 97.3°F | Resp 16 | Ht 61.0 in | Wt 178.4 lb

## 2013-09-27 DIAGNOSIS — C50511 Malignant neoplasm of lower-outer quadrant of right female breast: Secondary | ICD-10-CM

## 2013-09-27 DIAGNOSIS — C50519 Malignant neoplasm of lower-outer quadrant of unspecified female breast: Secondary | ICD-10-CM

## 2013-09-27 NOTE — Assessment & Plan Note (Signed)
No evidence of complications.    Pt has follow up with radiation oncology scheduled.    I will see her back in around 4-5 months.

## 2013-09-27 NOTE — Patient Instructions (Signed)
Follow up in 4-5 months.       ABC CLASS After Breast Cancer Class  After Breast Cancer Class is a specially designed exercise class to assist you in a safe recovery after having breast cancer surgery.  In this class you will learn how to get back to full function whether your drains were just removed or if you had surgery a month ago.  This one-time class is held the 1st and 3rd Monday of every month from 11:00 a.m. until 12:00 noon at the [place] located at [facility].  This class is FREE and space is limited.  For more information or to register for the next available class, call [phone].  Class Goals   Understand specific stretches to improve the flexibility of your chest and shoulder.   Learn ways to safely strengthen your upper body and improve your posture.   Understand the warning signs of infection and why you may be at risk for an arm infection.   Learn about Lymphedema and prevention.  **You do not attend this class until after surgery.  Drains must be removed to participate.     Serafina Royals, PT, CLT Leone Payor, PT, CLT

## 2013-09-27 NOTE — Progress Notes (Signed)
HISTORY: Pt is 2 weeks s/p right lumpectomy with SLN bx for high grade DCIS.  She is doing well.  She did not require any narcotics because the block was so well placed.  She denies fever/chills.    EXAM: General:  Alert and oriented. Incision:  Healing well.  No evidence of infection.  Small hematoma at axilla.     PATHOLOGY: Diagnosis 1. Breast, lumpectomy, Right - HIGH GRADE DUCTAL CARCINOMA IN SITU, SEE COMMENT. - IN SITU CARCINOMA IS 0.5 CM FROM NEAREST MARGIN (MEDIAL). - HEMORRHAGIC PREVIOUS BIOPSY SITE PRESENT. - SEE TUMOR SYNOPTIC TEMPLATE BELOW. 2. Lymph node, sentinel, biopsy, Right axillary - ONE LYMPH NODE, NEGATIVE FOR TUMOR (0/1). 3. Lymph node, sentinel, biopsy, Right axillary - ONE LYMPH NODE, NEGATIVE FOR TUMOR (0/1). 4. Lymph node, sentinel, biopsy, Right axillary - ONE LYMPH NODE, NEGATIVE FOR TUMOR (0/1).   ASSESSMENT AND PLAN:   Breast cancer of lower-outer quadrant of right female breast No evidence of complications.    Pt has follow up with radiation oncology scheduled.    I will see her back in around 4-5 months.        Milus Height, MD Surgical Oncology, [facility]  Unice Cobble, MD No ref. provider found

## 2013-09-29 ENCOUNTER — Encounter: Payer: Self-pay | Admitting: Radiation Oncology

## 2013-09-29 NOTE — Progress Notes (Signed)
Location of Breast Cancer: right lower outer  Histology per Pathology Report:  09/14/13 Diagnosis 1. Breast, lumpectomy, Right - HIGH GRADE DUCTAL CARCINOMA IN SITU, SEE COMMENT. - IN SITU CARCINOMA IS 0.5 CM FROM NEAREST MARGIN (MEDIAL). - HEMORRHAGIC PREVIOUS BIOPSY SITE PRESENT. - SEE TUMOR SYNOPTIC TEMPLATE BELOW. 2. Lymph node, sentinel, biopsy, Right axillary - ONE LYMPH NODE, NEGATIVE FOR TUMOR (0/1). 3. Lymph node, sentinel, biopsy, Right axillary - ONE LYMPH NODE, NEGATIVE FOR TUMOR (0/1). 4. Lymph node, sentinel, biopsy, Right axillary - ONE LYMPH NODE, NEGATIVE FOR TUMOR (0/1).  08/30/13 Diagnosis Breast, right, needle core biopsy - HIGH GRADE DUCTAL CARCINOMA IN SITU WITH NECROSIS.  Receptor Status: ER(-), PR (-), Her2-neu ()  Did patient present with symptoms (if so, please note symptoms) or was this found on screening mammography?: screening mammogram  Past/Anticipated interventions by surgeon, if any: lumpectomy, node sampling, Dr Barry Dienes  Past/Anticipated interventions by medical oncology, if any: Chemotherapy - none  Lymphedema issues, if any:   Slight swelling of right hand. Pt states she "has swelling anyway."  Pain issues, if any:   no  SAFETY ISSUES:  Prior radiation? no  Pacemaker/ICD? no  Possible current pregnancy? no  Is the patient on methotrexate? 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    Andria Rhein, RN 09/29/2013,9:56 AM

## 2013-09-30 ENCOUNTER — Ambulatory Visit
Admission: RE | Admit: 2013-09-30 | Discharge: 2013-09-30 | Disposition: A | Discharge status: Home / Self Care | Payer: BC Managed Care – PPO | Source: Ambulatory Visit | Attending: Radiation Oncology | Admitting: Radiation Oncology

## 2013-09-30 ENCOUNTER — Encounter: Payer: Self-pay | Admitting: Radiation Oncology

## 2013-09-30 VITALS — BP 119/62 | HR 69 | Temp 97.7°F | Resp 20 | Ht 61.0 in | Wt 181.0 lb

## 2013-09-30 DIAGNOSIS — Z170 Estrogen receptor positive status [ER+]: Secondary | ICD-10-CM | POA: Insufficient documentation

## 2013-09-30 DIAGNOSIS — C50511 Malignant neoplasm of lower-outer quadrant of right female breast: Secondary | ICD-10-CM

## 2013-09-30 DIAGNOSIS — Z803 Family history of malignant neoplasm of breast: Secondary | ICD-10-CM | POA: Insufficient documentation

## 2013-09-30 DIAGNOSIS — Z510 Encounter for antineoplastic radiation therapy: Secondary | ICD-10-CM | POA: Diagnosis present

## 2013-09-30 DIAGNOSIS — D0590 Unspecified type of carcinoma in situ of unspecified breast: Secondary | ICD-10-CM | POA: Diagnosis not present

## 2013-09-30 NOTE — Addendum Note (Signed)
Encounter addended by: Rexene Edison, MD on: 09/30/2013  5:33 PM<BR>     Documentation filed: Flowsheet VN, Orders

## 2013-09-30 NOTE — Progress Notes (Addendum)
CC: Dr. 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 At the time of a screening mammogram at [facility] on July 20 she was found to have a 1.6 cm area of suspicious calcifications at 7:00. A biopsy on July 27 was diagnostic for high-grade DCIS with necrosis. She was ER/PR negative. Breast MR on 09/06/2013 showed biopsy changes along the lateral aspect of the right breast a. Of note is that her sister was diagnosed with breast cancer but died from pulmonary emboli at age 32 before any therapy. The patient underwent BRCA testing in 2013 and she was found to be negative. Of note is that she has been on estrogen replacement therapy for approximately 5 years. She underwent a right partial mastectomy and sentinel lymph node biopsy on 09/14/2013. There is residual high-grade DCIS which was 0.5 cm from the nearest origin (medial). 3 sentinel lymph nodes were free of metastatic disease. She is doing well postoperatively.  Physical examination: Alert and oriented. Filed Vitals:   09/30/13 1028  BP: 119/62  Pulse: 69  Temp: 97.7 F (36.5 C)  Resp: 20   Nodes: There is no palpable cervical, supraclavicular, or axillary lymphadenopathy. Breasts: There is a partial mastectomy wound along the lower outer quadrant of the right breast extending from 7-9 clock. There may may be a small seroma. No masses are appreciated. Extremities: Without edema.  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 She is a candidate for breast preservation. Based on her breast size, I do not think she is a candidate for hypo-fractionated treatment. We will perform measurements at the time of her CT simulation. We first need to obtain a baseline right breast mammogram to confirm  removal of suspicious microcalcifications. This will be done at [facility]. We discussed the potential acute and late toxicities of radiation therapy. Consent is signed today I will then have her return for CT simulation after her mammogram in early to mid September.  Plan: As above  30 minutes was spent face-to-face with the patient, primarily counseling the patient and completing her care.

## 2013-09-30 NOTE — Progress Notes (Signed)
Please see the Nurse Progress Note in the MD Initial Consult Encounter for this patient. 

## 2013-10-01 ENCOUNTER — Telehealth: Payer: Self-pay | Admitting: *Deleted

## 2013-10-01 NOTE — Telephone Encounter (Signed)
Called patient to inform of mammogram on 10-15-13- arrival time - 10:30 am @ [facility], lvm for a return call.

## 2013-10-18 NOTE — Addendum Note (Signed)
Encounter addended by: Rexene Edison, MD on: 10/18/2013  6:06 PM<BR>     Documentation filed: Notes Section

## 2013-10-19 ENCOUNTER — Ambulatory Visit
Admission: RE | Admit: 2013-10-19 | Discharge: 2013-10-19 | Disposition: A | Discharge status: Home / Self Care | Payer: BC Managed Care – PPO | Source: Ambulatory Visit | Attending: Radiation Oncology | Admitting: Radiation Oncology

## 2013-10-19 DIAGNOSIS — C50511 Malignant neoplasm of lower-outer quadrant of right female breast: Secondary | ICD-10-CM

## 2013-10-19 DIAGNOSIS — Z510 Encounter for antineoplastic radiation therapy: Secondary | ICD-10-CM | POA: Diagnosis not present

## 2013-10-19 NOTE — Progress Notes (Signed)
Complex simulation/treatment planning note: The patient was taken to the CT simulator. She was placed supine on a custom breast board. A Vac lock immobilization device was constructed. The right breast was marked with radiopaque wires. Her partial mastectomy scar was also marked. She was then scanned. I chose an isocenter along the central breast. The CT data set was sent to the planning system for contouring of her tumor bed and other normal surrounding structures including the heart and lungs. 2 separate and unique multileaf collimators were designed for the medial and lateral right breast tangents. I am prescribing 4800 cGy in 24 sessions. There will not be a boost. She is now ready for 3-D simulation. She is not felt to be a candidate for hypo-fractionated treatment because of her breast size and tangent diameter of over 25 cm.

## 2013-10-25 ENCOUNTER — Encounter: Payer: Self-pay | Admitting: Radiation Oncology

## 2013-10-25 DIAGNOSIS — Z510 Encounter for antineoplastic radiation therapy: Secondary | ICD-10-CM | POA: Diagnosis not present

## 2013-10-25 NOTE — Progress Notes (Signed)
  Radiation Oncology         [phone] ________________________________  Name: Cynthia Wagner MRN: [id number]  Date: 10/25/2013  DOB: 04-19-1950  Optical Surface Tracking Plan:  Since intensity modulated radiotherapy (IMRT) and 3D conformal radiation treatment methods are predicated on accurate and precise positioning for treatment, intrafraction motion monitoring is medically necessary to ensure accurate and safe treatment delivery.  The ability to quantify intrafraction motion without excessive ionizing radiation dose can only be performed with optical surface tracking. Accordingly, surface imaging offers the opportunity to obtain 3D measurements of patient position throughout IMRT and 3D treatments without excessive radiation exposure.  I am ordering optical surface tracking for this patient's upcoming course of radiotherapy. ________________________________  Rexene Edison, MD 10/25/2013 10:39 AM    Reference:   Ursula Alert, J, et al. Surface imaging-based analysis of intrafraction motion for breast radiotherapy patients.Journal of [place], n. 6, nov. 2014. ISSN [id number].   Available at: <http://www.jacmp.org/index.php/jacmp/article/view/4957>.

## 2013-10-25 NOTE — Progress Notes (Signed)
3-D simulation note: The patient completed 3-D simulation for treatment to her right breast. She is set up to medial and lateral right breast tangents with 1. Fields and to reduce fields for a total of 3 complex treatment devices/custom [facility] his for each field (6 devices altogether). Dose volume histograms were obtained for the target structures and also lung/heart. We met our departmental guidelines. I prescribing 4800 cGy in 24 sessions utilizing mixed 6 MV/10 MV photons. No boost.

## 2013-10-26 ENCOUNTER — Ambulatory Visit
Admission: RE | Admit: 2013-10-26 | Discharge: 2013-10-26 | Disposition: A | Discharge status: Home / Self Care | Payer: BC Managed Care – PPO | Source: Ambulatory Visit | Attending: Radiation Oncology | Admitting: Radiation Oncology

## 2013-10-26 ENCOUNTER — Ambulatory Visit: Payer: BC Managed Care – PPO

## 2013-10-26 DIAGNOSIS — Z510 Encounter for antineoplastic radiation therapy: Secondary | ICD-10-CM | POA: Diagnosis not present

## 2013-10-26 DIAGNOSIS — C50511 Malignant neoplasm of lower-outer quadrant of right female breast: Secondary | ICD-10-CM

## 2013-10-26 NOTE — Progress Notes (Signed)
Simulation verification note: The patient underwent simulation verification for treatment to her right breast. Her isocenter is in good position and the multileaf collimators contoured the treatment volume appropriately.

## 2013-10-27 ENCOUNTER — Ambulatory Visit
Admission: RE | Admit: 2013-10-27 | Discharge: 2013-10-27 | Disposition: A | Discharge status: Home / Self Care | Payer: BC Managed Care – PPO | Source: Ambulatory Visit | Attending: Radiation Oncology | Admitting: Radiation Oncology

## 2013-10-27 DIAGNOSIS — Z510 Encounter for antineoplastic radiation therapy: Secondary | ICD-10-CM | POA: Diagnosis not present

## 2013-10-28 ENCOUNTER — Ambulatory Visit
Admission: RE | Admit: 2013-10-28 | Discharge: 2013-10-28 | Disposition: A | Discharge status: Home / Self Care | Payer: BC Managed Care – PPO | Source: Ambulatory Visit | Attending: Radiation Oncology | Admitting: Radiation Oncology

## 2013-10-28 DIAGNOSIS — C50511 Malignant neoplasm of lower-outer quadrant of right female breast: Secondary | ICD-10-CM

## 2013-10-28 DIAGNOSIS — Z510 Encounter for antineoplastic radiation therapy: Secondary | ICD-10-CM | POA: Diagnosis not present

## 2013-10-28 MED ORDER — RADIAPLEXRX EX GEL
Freq: Once | CUTANEOUS | Status: AC
  Administered 2013-10-28: 09:00:00 via TOPICAL

## 2013-10-28 MED ORDER — ALRA NON-METALLIC DEODORANT (RAD-ONC)
1.0000 "application " | Freq: Once | TOPICAL | Status: AC
  Administered 2013-10-28: 1 via TOPICAL

## 2013-10-28 NOTE — Progress Notes (Signed)
Patient education completed with patient and husband. Gave pt "Radiation and You" booklet with all pertinent information marked and discussed, re: fatigue, skin irritation/management/care, nutrition, pain. Gave pt Radiaplex, [place] with instructions for proper use. Pt and husband verbalized understanding. Teach back used.

## 2013-10-29 ENCOUNTER — Ambulatory Visit
Admission: RE | Admit: 2013-10-29 | Discharge: 2013-10-29 | Disposition: A | Discharge status: Home / Self Care | Payer: BC Managed Care – PPO | Source: Ambulatory Visit | Attending: Radiation Oncology | Admitting: Radiation Oncology

## 2013-10-29 DIAGNOSIS — Z510 Encounter for antineoplastic radiation therapy: Secondary | ICD-10-CM | POA: Diagnosis not present

## 2013-11-01 ENCOUNTER — Ambulatory Visit
Admission: RE | Admit: 2013-11-01 | Discharge: 2013-11-01 | Disposition: A | Discharge status: Home / Self Care | Payer: BC Managed Care – PPO | Source: Ambulatory Visit | Attending: Radiation Oncology | Admitting: Radiation Oncology

## 2013-11-01 ENCOUNTER — Encounter: Payer: Self-pay | Admitting: Radiation Oncology

## 2013-11-01 VITALS — BP 133/66 | HR 65 | Temp 97.7°F | Resp 20 | Wt 183.3 lb

## 2013-11-01 DIAGNOSIS — C50511 Malignant neoplasm of lower-outer quadrant of right female breast: Secondary | ICD-10-CM

## 2013-11-01 DIAGNOSIS — Z510 Encounter for antineoplastic radiation therapy: Secondary | ICD-10-CM | POA: Diagnosis not present

## 2013-11-01 NOTE — Progress Notes (Signed)
Patient denies pain, fatigue, loss of appetite. She states she occasionally has "burning pain in her right breast". She is applying Radiaplex lotion; no skin changes at this time.

## 2013-11-01 NOTE — Progress Notes (Signed)
Weekly Management Note:  Site: Right breast Current Dose:  800  cGy Projected Dose: 4800  CGy, no boost  Narrative: The patient is seen today for routine under treatment assessment. CBCT/MVCT images/port films were reviewed. The chart was reviewed.   She is without complaints today except for a "burning" sensation along her right breast. She uses Radioplex gel.  Physical Examination:  Filed Vitals:   11/01/13 0938  BP: 133/66  Pulse: 65  Temp: 97.7 F (36.5 C)  Resp: 20  .  Weight: 183 lb 4.8 oz (83.144 kg). There is faint hyperpigmentation the skin along the right breast with no areas of desquamation.  Impression: Tolerating radiation therapy well.  Plan: Continue radiation therapy as planned.

## 2013-11-02 ENCOUNTER — Ambulatory Visit
Admission: RE | Admit: 2013-11-02 | Discharge: 2013-11-02 | Disposition: A | Discharge status: Home / Self Care | Payer: BC Managed Care – PPO | Source: Ambulatory Visit | Attending: Radiation Oncology | Admitting: Radiation Oncology

## 2013-11-02 DIAGNOSIS — Z510 Encounter for antineoplastic radiation therapy: Secondary | ICD-10-CM | POA: Diagnosis not present

## 2013-11-03 ENCOUNTER — Ambulatory Visit
Admission: RE | Admit: 2013-11-03 | Discharge: 2013-11-03 | Disposition: A | Discharge status: Home / Self Care | Payer: BC Managed Care – PPO | Source: Ambulatory Visit | Attending: Radiation Oncology | Admitting: Radiation Oncology

## 2013-11-03 DIAGNOSIS — Z510 Encounter for antineoplastic radiation therapy: Secondary | ICD-10-CM | POA: Diagnosis not present

## 2013-11-04 ENCOUNTER — Ambulatory Visit
Admission: RE | Admit: 2013-11-04 | Discharge: 2013-11-04 | Disposition: A | Discharge status: Home / Self Care | Payer: BC Managed Care – PPO | Source: Ambulatory Visit | Attending: Radiation Oncology | Admitting: Radiation Oncology

## 2013-11-04 DIAGNOSIS — Z510 Encounter for antineoplastic radiation therapy: Secondary | ICD-10-CM | POA: Diagnosis present

## 2013-11-04 DIAGNOSIS — C50511 Malignant neoplasm of lower-outer quadrant of right female breast: Secondary | ICD-10-CM | POA: Insufficient documentation

## 2013-11-04 DIAGNOSIS — L819 Disorder of pigmentation, unspecified: Secondary | ICD-10-CM | POA: Insufficient documentation

## 2013-11-04 DIAGNOSIS — N63 Unspecified lump in breast: Secondary | ICD-10-CM | POA: Diagnosis not present

## 2013-11-04 DIAGNOSIS — R5383 Other fatigue: Secondary | ICD-10-CM | POA: Diagnosis not present

## 2013-11-04 DIAGNOSIS — L299 Pruritus, unspecified: Secondary | ICD-10-CM | POA: Diagnosis not present

## 2013-11-05 ENCOUNTER — Ambulatory Visit
Admission: RE | Admit: 2013-11-05 | Discharge: 2013-11-05 | Disposition: A | Discharge status: Home / Self Care | Payer: BC Managed Care – PPO | Source: Ambulatory Visit | Attending: Radiation Oncology | Admitting: Radiation Oncology

## 2013-11-05 DIAGNOSIS — Z510 Encounter for antineoplastic radiation therapy: Secondary | ICD-10-CM | POA: Diagnosis not present

## 2013-11-08 ENCOUNTER — Encounter: Payer: Self-pay | Admitting: Radiation Oncology

## 2013-11-08 ENCOUNTER — Ambulatory Visit
Admission: RE | Admit: 2013-11-08 | Discharge: 2013-11-08 | Disposition: A | Discharge status: Home / Self Care | Payer: BC Managed Care – PPO | Source: Ambulatory Visit | Attending: Radiation Oncology | Admitting: Radiation Oncology

## 2013-11-08 VITALS — BP 110/65 | HR 52 | Temp 97.7°F | Resp 20 | Wt 181.6 lb

## 2013-11-08 DIAGNOSIS — Z510 Encounter for antineoplastic radiation therapy: Secondary | ICD-10-CM | POA: Diagnosis not present

## 2013-11-08 DIAGNOSIS — C50511 Malignant neoplasm of lower-outer quadrant of right female breast: Secondary | ICD-10-CM

## 2013-11-08 NOTE — Addendum Note (Signed)
Encounter addended by: Andria Rhein, RN on: 11/08/2013  8:53 AM<BR>     Documentation filed: Inpatient Document Flowsheet

## 2013-11-08 NOTE — Progress Notes (Signed)
Weekly Management Note:  Site: Right breast Current Dose:  1800  cGy Projected Dose: 4800  CGy, no boost  Narrative: The patient is seen today for routine under treatment assessment. CBCT/MVCT images/port films were reviewed. The chart was reviewed.   She is without new complaints today although she does have mild to moderate fatigue. She uses Radioplex gel.  Physical Examination:  Filed Vitals:   11/08/13 0822  BP: 110/65  Pulse: 52  Temp: 97.7 F (36.5 C)  Resp: 20  .  Weight: 181 lb 9.6 oz (82.373 kg). There are no significant skin changes. No areas of desquamation  Impression: Tolerating radiation therapy well.  Plan: Continue radiation therapy as planned.

## 2013-11-08 NOTE — Progress Notes (Signed)
Patient states over the weekend she experienced warmth, "swelling", tenderness of her right breast. She states the skin "is slightly darker". She is applying Radiaplex twice daily. She denies fatigue, loss of appetite.

## 2013-11-09 ENCOUNTER — Ambulatory Visit
Admission: RE | Admit: 2013-11-09 | Discharge: 2013-11-09 | Disposition: A | Discharge status: Home / Self Care | Payer: BC Managed Care – PPO | Source: Ambulatory Visit | Attending: Radiation Oncology | Admitting: Radiation Oncology

## 2013-11-09 DIAGNOSIS — Z510 Encounter for antineoplastic radiation therapy: Secondary | ICD-10-CM | POA: Diagnosis not present

## 2013-11-10 ENCOUNTER — Ambulatory Visit
Admission: RE | Admit: 2013-11-10 | Discharge: 2013-11-10 | Disposition: A | Discharge status: Home / Self Care | Payer: BC Managed Care – PPO | Source: Ambulatory Visit | Attending: Radiation Oncology | Admitting: Radiation Oncology

## 2013-11-10 DIAGNOSIS — Z510 Encounter for antineoplastic radiation therapy: Secondary | ICD-10-CM | POA: Diagnosis not present

## 2013-11-11 ENCOUNTER — Ambulatory Visit
Admission: RE | Admit: 2013-11-11 | Discharge: 2013-11-11 | Disposition: A | Discharge status: Home / Self Care | Payer: BC Managed Care – PPO | Source: Ambulatory Visit | Attending: Radiation Oncology | Admitting: Radiation Oncology

## 2013-11-11 DIAGNOSIS — Z510 Encounter for antineoplastic radiation therapy: Secondary | ICD-10-CM | POA: Diagnosis not present

## 2013-11-12 ENCOUNTER — Ambulatory Visit
Admission: RE | Admit: 2013-11-12 | Discharge: 2013-11-12 | Disposition: A | Discharge status: Home / Self Care | Payer: BC Managed Care – PPO | Source: Ambulatory Visit | Attending: Radiation Oncology | Admitting: Radiation Oncology

## 2013-11-12 DIAGNOSIS — Z510 Encounter for antineoplastic radiation therapy: Secondary | ICD-10-CM | POA: Diagnosis not present

## 2013-11-15 ENCOUNTER — Ambulatory Visit
Admission: RE | Admit: 2013-11-15 | Discharge: 2013-11-15 | Disposition: A | Discharge status: Home / Self Care | Payer: BC Managed Care – PPO | Source: Ambulatory Visit | Attending: Radiation Oncology | Admitting: Radiation Oncology

## 2013-11-15 ENCOUNTER — Encounter: Payer: Self-pay | Admitting: Radiation Oncology

## 2013-11-15 VITALS — BP 104/65 | HR 53 | Temp 98.0°F | Resp 20 | Wt 180.8 lb

## 2013-11-15 DIAGNOSIS — C50511 Malignant neoplasm of lower-outer quadrant of right female breast: Secondary | ICD-10-CM | POA: Diagnosis not present

## 2013-11-15 DIAGNOSIS — Z510 Encounter for antineoplastic radiation therapy: Secondary | ICD-10-CM | POA: Insufficient documentation

## 2013-11-15 DIAGNOSIS — L299 Pruritus, unspecified: Secondary | ICD-10-CM | POA: Insufficient documentation

## 2013-11-15 DIAGNOSIS — L599 Disorder of the skin and subcutaneous tissue related to radiation, unspecified: Secondary | ICD-10-CM | POA: Insufficient documentation

## 2013-11-15 MED ORDER — RADIAPLEXRX EX GEL
Freq: Once | CUTANEOUS | Status: AC
  Administered 2013-11-15: 09:00:00 via TOPICAL

## 2013-11-15 NOTE — Progress Notes (Signed)
Patient denies pain, fatigue over her baseline, loss of appetite. She has occasional itching of her right breast, advised she may apply Cortisone cream 1% as needed. She is applying Radiaplex , some skin darkening of left breast treatment area.

## 2013-11-15 NOTE — Progress Notes (Signed)
Weekly Management Note:  Site: Right breast Current Dose:  2800  cGy Projected Dose: 4800  cGy, no boost  Narrative: The patient is seen today for routine under treatment assessment. CBCT/MVCT images/port films were reviewed. The chart was reviewed.   She still doing well although she does have occasional pruritus. She uses hydrocortisone cream and also Radioplex gel.  Physical Examination:  Filed Vitals:   11/15/13 0831  BP: 104/65  Pulse: 53  Temp: 98 F (36.7 C)  Resp: 20  .  Weight: 180 lb 12.8 oz (82.01 kg). There is hyperpigmentation the skin along the right breast with no areas of desquamation.  Impression: Tolerating radiation therapy well.  Plan: Continue radiation therapy as planned.

## 2013-11-16 ENCOUNTER — Ambulatory Visit
Admission: RE | Admit: 2013-11-16 | Discharge: 2013-11-16 | Disposition: A | Discharge status: Home / Self Care | Payer: BC Managed Care – PPO | Source: Ambulatory Visit | Attending: Radiation Oncology | Admitting: Radiation Oncology

## 2013-11-16 DIAGNOSIS — Z510 Encounter for antineoplastic radiation therapy: Secondary | ICD-10-CM | POA: Diagnosis not present

## 2013-11-17 ENCOUNTER — Ambulatory Visit
Admission: RE | Admit: 2013-11-17 | Discharge: 2013-11-17 | Disposition: A | Discharge status: Home / Self Care | Payer: BC Managed Care – PPO | Source: Ambulatory Visit | Attending: Radiation Oncology | Admitting: Radiation Oncology

## 2013-11-17 DIAGNOSIS — Z510 Encounter for antineoplastic radiation therapy: Secondary | ICD-10-CM | POA: Diagnosis not present

## 2013-11-18 ENCOUNTER — Ambulatory Visit
Admission: RE | Admit: 2013-11-18 | Discharge: 2013-11-18 | Disposition: A | Discharge status: Home / Self Care | Payer: BC Managed Care – PPO | Source: Ambulatory Visit | Attending: Radiation Oncology | Admitting: Radiation Oncology

## 2013-11-18 DIAGNOSIS — Z510 Encounter for antineoplastic radiation therapy: Secondary | ICD-10-CM | POA: Diagnosis not present

## 2013-11-19 ENCOUNTER — Ambulatory Visit
Admission: RE | Admit: 2013-11-19 | Discharge: 2013-11-19 | Disposition: A | Discharge status: Home / Self Care | Payer: BC Managed Care – PPO | Source: Ambulatory Visit | Attending: Radiation Oncology | Admitting: Radiation Oncology

## 2013-11-19 DIAGNOSIS — Z510 Encounter for antineoplastic radiation therapy: Secondary | ICD-10-CM | POA: Diagnosis not present

## 2013-11-22 ENCOUNTER — Ambulatory Visit
Admission: RE | Admit: 2013-11-22 | Discharge: 2013-11-22 | Disposition: A | Discharge status: Home / Self Care | Payer: BC Managed Care – PPO | Source: Ambulatory Visit | Attending: Radiation Oncology | Admitting: Radiation Oncology

## 2013-11-22 ENCOUNTER — Encounter: Payer: Self-pay | Admitting: Radiation Oncology

## 2013-11-22 VITALS — BP 112/59 | HR 58 | Temp 98.0°F | Resp 20 | Wt 182.1 lb

## 2013-11-22 DIAGNOSIS — Z510 Encounter for antineoplastic radiation therapy: Secondary | ICD-10-CM | POA: Diagnosis not present

## 2013-11-22 DIAGNOSIS — C50511 Malignant neoplasm of lower-outer quadrant of right female breast: Secondary | ICD-10-CM

## 2013-11-22 NOTE — Progress Notes (Signed)
Patient denies pain, loss of appetite. Skin irritation. She states she is fatigued, but that this may be due to her 10 hour shifts of work. She is applying Radiaplex to right breast for darkening of skin on right breast.

## 2013-11-22 NOTE — Progress Notes (Signed)
Weekly Management Note:  Site: Right breast Current Dose:  3800  cGy Projected Dose: 4800  CGy, no boost  Narrative: The patient is seen today for routine under treatment assessment. CBCT/MVCT images/port films were reviewed. The chart was reviewed.   She is without complaints today except for moderate fatigue. She uses hydrocortisone cream and Radioplex gel.  Physical Examination:  Filed Vitals:   11/22/13 0802  BP: 112/59  Pulse: 58  Temp: 98 F (36.7 C)  Resp: 20  .  Weight: 182 lb 1.6 oz (82.6 kg). There is hyperpigmentation the skin, particularly along the anterior axilla and inframammary region.  Impression: Tolerating radiation therapy well.  Plan: Continue radiation therapy as planned.

## 2013-11-23 ENCOUNTER — Ambulatory Visit
Admission: RE | Admit: 2013-11-23 | Discharge: 2013-11-23 | Disposition: A | Discharge status: Home / Self Care | Payer: BC Managed Care – PPO | Source: Ambulatory Visit | Attending: Radiation Oncology | Admitting: Radiation Oncology

## 2013-11-23 DIAGNOSIS — Z510 Encounter for antineoplastic radiation therapy: Secondary | ICD-10-CM | POA: Diagnosis not present

## 2013-11-24 ENCOUNTER — Ambulatory Visit
Admission: RE | Admit: 2013-11-24 | Discharge: 2013-11-24 | Disposition: A | Discharge status: Home / Self Care | Payer: BC Managed Care – PPO | Source: Ambulatory Visit | Attending: Radiation Oncology | Admitting: Radiation Oncology

## 2013-11-24 DIAGNOSIS — Z510 Encounter for antineoplastic radiation therapy: Secondary | ICD-10-CM | POA: Diagnosis not present

## 2013-11-25 ENCOUNTER — Ambulatory Visit
Admission: RE | Admit: 2013-11-25 | Discharge: 2013-11-25 | Disposition: A | Discharge status: Home / Self Care | Payer: BC Managed Care – PPO | Source: Ambulatory Visit | Attending: Radiation Oncology | Admitting: Radiation Oncology

## 2013-11-25 DIAGNOSIS — Z510 Encounter for antineoplastic radiation therapy: Secondary | ICD-10-CM | POA: Diagnosis not present

## 2013-11-26 ENCOUNTER — Ambulatory Visit
Admission: RE | Admit: 2013-11-26 | Discharge: 2013-11-26 | Disposition: A | Discharge status: Home / Self Care | Payer: BC Managed Care – PPO | Source: Ambulatory Visit | Attending: Radiation Oncology | Admitting: Radiation Oncology

## 2013-11-26 DIAGNOSIS — Z510 Encounter for antineoplastic radiation therapy: Secondary | ICD-10-CM | POA: Diagnosis not present

## 2013-11-29 ENCOUNTER — Ambulatory Visit: Payer: BC Managed Care – PPO

## 2013-11-29 ENCOUNTER — Encounter: Payer: Self-pay | Admitting: Radiation Oncology

## 2013-11-29 ENCOUNTER — Ambulatory Visit
Admission: RE | Admit: 2013-11-29 | Discharge: 2013-11-29 | Disposition: A | Discharge status: Home / Self Care | Payer: BC Managed Care – PPO | Source: Ambulatory Visit | Attending: Radiation Oncology | Admitting: Radiation Oncology

## 2013-11-29 VITALS — BP 137/69 | HR 62 | Temp 98.0°F | Resp 20 | Wt 181.1 lb

## 2013-11-29 DIAGNOSIS — Z510 Encounter for antineoplastic radiation therapy: Secondary | ICD-10-CM | POA: Diagnosis not present

## 2013-11-29 DIAGNOSIS — C50511 Malignant neoplasm of lower-outer quadrant of right female breast: Secondary | ICD-10-CM

## 2013-11-29 MED ORDER — RADIAPLEXRX EX GEL
Freq: Once | CUTANEOUS | Status: AC
  Administered 2013-11-29: 09:00:00 via TOPICAL

## 2013-11-29 NOTE — Addendum Note (Signed)
Encounter addended by: Andria Rhein, RN on: 11/29/2013  9:25 AM<BR>     Documentation filed: Inpatient MAR

## 2013-11-29 NOTE — Addendum Note (Signed)
Encounter addended by: Andria Rhein, RN on: 11/29/2013  9:23 AM<BR>     Documentation filed: Orders

## 2013-11-29 NOTE — Progress Notes (Signed)
[facility] End of Treatment Note  Name:Ahlaya NORAH DEVIN  Date: 11/29/2013 GMW:[id number] DOB:[birth year]   Status:outpatient    CC: Unice Cobble, MD  Dr. Stark Klein  REFERRING PHYSICIAN:  Dr. 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 Sawtell tolerated treatment well with only moderate radiation dermatitis during her course of therapy. She does have mild to moderate fatigue.                         PLAN: Routine followup in one month. Patient instructed to call if questions or worsening complaints in interim. She tells me that she does have a follow-up to see Dr. Barry Dienes, and even though she was receptor negative, she may be a candidate for  preventative antiestrogen therapy to reduce the risk for hormone receptor positive cancers in the future. I defer to Dr. Barry Dienes for referral to medical oncology if the patient is in agreement.

## 2013-11-29 NOTE — Progress Notes (Signed)
Weekly Management Note:  Site: Right breast Current Dose:  4800  cGy Projected Dose: 4800  cGy  Narrative: The patient is seen today for routine under treatment assessment. CBCT/MVCT images/port films were reviewed. The chart was reviewed.   Radiation therapy is completed today. She is without complaints today except for moderate fatigue and slight pruritus. She uses Radioplex gel.  Physical Examination:  Filed Vitals:   11/29/13 0819  BP: 137/69  Pulse: 62  Temp: 98 F (36.7 C)  Resp: 20  .  Weight: 181 lb 1.6 oz (82.146 kg). There is marked hyperpigmentation of the skin with patchy dry desquamation and no areas of moist desquamation.  Impression: Tolerating radiation therapy well. Radiation therapy completed.  Plan: Follow visit in one month.

## 2013-11-29 NOTE — Progress Notes (Signed)
Patient denies pain today. She is fatigued, has darkening of skin of right breast, no desquamation per pt report. She is applying Radiaplex, gave her another tube.  She completed today, has FU appointment.

## 2013-11-30 ENCOUNTER — Ambulatory Visit: Payer: BC Managed Care – PPO

## 2013-12-05 HISTORY — PX: OTHER SURGICAL HISTORY: SHX169

## 2013-12-16 ENCOUNTER — Telehealth: Payer: Self-pay | Admitting: *Deleted

## 2013-12-16 NOTE — Telephone Encounter (Signed)
Received referral from [facility].  Called pt and confirmed 12/28/13 appt w/ her.  Mailed before appt letter, calendar, welcoming packet & intake form to pt.  Emailed Engineer, civil (consulting) at [employer] to make her aware.  Send office note to HIM to scan.  Added to spreadsheet.

## 2013-12-22 ENCOUNTER — Encounter: Payer: Self-pay | Admitting: *Deleted

## 2013-12-27 ENCOUNTER — Telehealth: Payer: Self-pay | Admitting: Internal Medicine

## 2013-12-27 MED ORDER — METOPROLOL TARTRATE 50 MG PO TABS: 50.0000 mg | ORAL_TABLET | ORAL | Status: DC

## 2013-12-27 NOTE — Telephone Encounter (Signed)
Pt states she need a refill, pharmacy said call MD office, metoprol 50 mg takes for palpitations. [facility] / [phone]

## 2013-12-27 NOTE — Telephone Encounter (Signed)
I spoke to patient. Metoprolol has been sent to [facility] in [facility] on [address]

## 2013-12-28 ENCOUNTER — Encounter: Payer: Self-pay | Admitting: Radiation Oncology

## 2013-12-28 ENCOUNTER — Encounter: Payer: Self-pay | Admitting: Hematology and Oncology

## 2013-12-28 ENCOUNTER — Ambulatory Visit
Admission: RE | Admit: 2013-12-28 | Discharge: 2013-12-28 | Disposition: A | Discharge status: Home / Self Care | Payer: BC Managed Care – PPO | Source: Ambulatory Visit | Attending: Radiation Oncology | Admitting: Radiation Oncology

## 2013-12-28 ENCOUNTER — Ambulatory Visit (HOSPITAL_BASED_OUTPATIENT_CLINIC_OR_DEPARTMENT_OTHER): Payer: BC Managed Care – PPO | Admitting: Hematology and Oncology

## 2013-12-28 ENCOUNTER — Ambulatory Visit: Payer: BC Managed Care – PPO

## 2013-12-28 VITALS — BP 145/70 | HR 59 | Temp 98.2°F | Resp 20 | Ht 61.0 in | Wt 176.4 lb

## 2013-12-28 VITALS — BP 138/62 | HR 62 | Temp 97.9°F | Resp 20 | Wt 177.0 lb

## 2013-12-28 DIAGNOSIS — C50511 Malignant neoplasm of lower-outer quadrant of right female breast: Secondary | ICD-10-CM

## 2013-12-28 DIAGNOSIS — D0511 Intraductal carcinoma in situ of right breast: Secondary | ICD-10-CM

## 2013-12-28 DIAGNOSIS — Z171 Estrogen receptor negative status [ER-]: Secondary | ICD-10-CM

## 2013-12-28 NOTE — Progress Notes (Signed)
Patient denies pain, loss of appetite. She states she is always somewhat fatigued, but she is not back to work at this time. She states her skin of right breast is "mostly healed, slight flakiness".

## 2013-12-28 NOTE — Progress Notes (Signed)
[facility] NOTE  Patient Care Team: Hendricks Limes, MD as PCP - General Stark Klein, MD as Consulting Physician (General Surgery) Rexene Edison, MD as Consulting Physician (Radiation Oncology)  CHIEF COMPLAINTS/PURPOSE OF CONSULTATION:  Right breast DCIS  HISTORY OF PRESENTING ILLNESS:  Cynthia Wagner 63 y.o. female is here because of recent diagnosis of right breast DCIS. She had a routine screening mammogram that revealed abnormal calcifications. This led to ultrasound and biopsy which confirmed high-grade DCIS with necrosis. ER PR 0% negative. She underwent a breast MRI 06/06/2013 which revealed the abnormality that was noted on the mammogram and ultrasound. She then underwent a lumpectomy on 09/14/2013 that revealed high-grade DCIS with 3 negative sentinel lymph nodes. She was seen by Dr. Valere Dross who performed radiation therapy and she recently completed radiation treatment without any major problems or concerns. She is a Music therapist at the [employer] and is here today accompanied by her husband to discuss any treatment options after completion of radiation therapy.   I reviewed her records extensively and collaborated the history with the patient.  SUMMARY OF ONCOLOGIC HISTORY:   Breast cancer of lower-outer quadrant of right female breast   08/30/2013 Pathology Results Breast, right, needle core biopsy - HIGH GRADE DUCTAL CARCINOMA IN SITU WITH NECROSIS. ER 0%, PR 0%   09/06/2013 Breast MRI Right breast: In the lateral aspect of the middle third of the right breast are post biopsy changes with a 3.8 cm hematoma. Signal void artifact corresponds with the biopsy clip. There is surrounding enhancement, expected following biopsy.    09/14/2013 Surgery Right breast lumpectomy: High-grade DCIS 3 SLN's negative ER 0% PR 0%   10/28/2013 - 11/26/2013 Radiation Therapy Adjuvant radiation therapy   MEDICAL HISTORY:  Past Medical History  Diagnosis Date  .  IBS (irritable bowel syndrome)   . Sleep apnea     uses CPAP most nights  . Scoliosis   . Hypertension     under control with med., has been on med. since 2006  . Breast cancer 09/2013    right  . Complication of anesthesia     states is sensitive to narcotics  . PONV (postoperative nausea and vomiting)   . Dental bridge present     left upper; right upper and lower  . History of radiation therapy 10/27/13- 11/29/13    right breast 4800 cGy 24 sessions    SURGICAL HISTORY: Past Surgical History  Procedure Laterality Date  . Carpal tunnel release Bilateral 1991  . Colonoscopy  2003; 2006  . Vulva /perineum biopsy    . Hemorrhoid surgery    . Esophagogastroduodenoscopy  10/11/2003  . Hammer toe surgery Left 06/25/2011  . Cesarean section      x 2  . Breast lumpectomy with needle localization and axillary sentinel lymph node bx Right 09/14/2013    Procedure: BREAST LUMPECTOMY WITH NEEDLE LOCALIZATION AND AXILLARY SENTINEL LYMPH NODE BX;  Surgeon: Stark Klein, MD;  Location: [facility];  Service: General;  Laterality: Right;  . Vaginal hysterectomy  1995    ovaries intact, fibroids    SOCIAL HISTORY: History   Social History  . Marital Status: Married    Spouse Name: N/A    Number of Children: N/A  . Years of Education: N/A   Occupational History  . Not on file.   Social History Main Topics  . Smoking status: Never Smoker   . Smokeless tobacco: Never Used  . Alcohol  Use: Yes     Comment: 2 glasses wine/week  . Drug Use: No  . Sexual Activity: Not on file     Comment: menses age 89, P2, 1st live birth 33,  HRT x 15 yrs   Other Topics Concern  . Not on file   Social History Narrative    FAMILY HISTORY: Family History  Problem Relation Age of Onset  . Heart attack Father 89  . Stroke Mother 73  . Breast cancer Sister   . Pulmonary embolism Sister     DVT in context breast cancer  . Hypertension Brother   . Leukemia Brother   . Tuberculosis  Maternal Uncle   . Diabetes Maternal Aunt   . Other Sister     cerebral amyloid angiopathy  . Pulmonary embolism Daughter     on BCP; non smoker    ALLERGIES:  has No Known Allergies.  MEDICATIONS:  Current Outpatient Prescriptions  Medication Sig Dispense Refill  . ALPRAZolam (XANAX) 0.25 MG tablet   5  . aspirin 81 MG tablet Take 81 mg by mouth. 2 by mouth at bedtime    . BRISDELLE 7.5 MG CAPS   12  . Calcium Carbonate-Vitamin D (CALCIUM + D PO) Take by mouth. 2 by mouth daily    . cycloSPORINE (RESTASIS) 0.05 % ophthalmic emulsion 1 drop 2 (two) times daily.      . fluconazole (DIFLUCAN) 150 MG tablet   1  . hydrocortisone valerate cream (WESTCORT) 0.2 % Apply 1 application topically as needed.    Marland Kitchen Linaclotide (LINZESS) 290 MCG CAPS Take by mouth daily.    Marland Kitchen losartan-hydrochlorothiazide (HYZAAR) 50-12.5 MG per tablet TAKE 1 TABLET BY MOUTH EVERY DAY 90 tablet 3  . metoprolol (LOPRESSOR) 50 MG tablet Take 1 tablet (50 mg total) by mouth as directed. 90 tablet 1  . Multiple Vitamin (MULTIVITAMIN) tablet Take 1 tablet by mouth daily.      . Omega-3 Fatty Acids (FISH OIL) 1000 MG CAPS Take 1,000 mg by mouth daily.      . polyethylene glycol (MIRALAX / GLYCOLAX) packet Take 17 g by mouth as needed.    . Probiotic Product (PROBIOTIC PO) Take by mouth daily.       No current facility-administered medications for this visit.    REVIEW OF SYSTEMS:   Constitutional: Denies fevers, chills or abnormal night sweats Eyes: Denies blurriness of vision, double vision or watery eyes Ears, nose, mouth, throat, and face: Denies mucositis or sore throat Respiratory: Denies cough, dyspnea or wheezes Cardiovascular: Denies palpitation, chest discomfort or lower extremity swelling Gastrointestinal:  Denies nausea, heartburn or change in bowel habits Skin: Denies abnormal skin rashes Lymphatics: Denies new lymphadenopathy or easy bruising Neurological:Denies numbness, tingling or new  weaknesses Behavioral/Psych: Mood is stable, no new changes  Breast: mild redness from recent radiation All other systems were reviewed with the patient and are negative.  PHYSICAL EXAMINATION: ECOG PERFORMANCE STATUS: 0 - Asymptomatic  Filed Vitals:   12/28/13 0929  BP: 145/70  Pulse: 59  Temp: 98.2 F (36.8 C)  Resp: 20   Filed Weights   12/28/13 0929  Weight: 176 lb 6.4 oz (80.015 kg)    GENERAL:alert, no distress and comfortable SKIN: skin color, texture, turgor are normal, no rashes or significant lesions EYES: normal, conjunctiva are pink and non-injected, sclera clear OROPHARYNX:no exudate, no erythema and lips, buccal mucosa, and tongue normal  NECK: supple, thyroid normal size, non-tender, without nodularity LYMPH:  no palpable lymphadenopathy in  the cervical, axillary or inguinal LUNGS: clear to auscultation and percussion with normal breathing effort HEART: regular rate & rhythm and no murmurs and no lower extremity edema ABDOMEN:abdomen soft, non-tender and normal bowel sounds Musculoskeletal:no cyanosis of digits and no clubbing  PSYCH: alert & oriented x 3 with fluent speech NEURO: no focal motor/sensory deficits LABORATORY DATA:  I have reviewed the data as listed Lab Results  Component Value Date   WBC 5.5 05/10/2013   HGB 12.8 09/14/2013   HCT 42.2 05/10/2013   MCV 88.9 05/10/2013   PLT 213.0 05/10/2013   Lab Results  Component Value Date   NA 142 09/09/2013   K 4.2 09/09/2013   CL 104 09/09/2013   CO2 29 09/09/2013    RADIOGRAPHIC STUDIES: I have personally reviewed the radiological reports and agreed with the findings in the report. 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 I discussed the difference between DCIS and invasive ductal carcinoma. I agree with the current treatment plan for observation after finishing with radiation therapy. There is no role of antiestrogen therapy because she is ER/PR negative.  Recommendation: Observation and surveillance  Patient would like to followup with Dr. Barry Dienes and Dr. Valere Dross for her surveillance. I offered our services for survivorship and surveillance.    Rulon Eisenmenger, MD 12/28/2013 10:51 AM

## 2013-12-28 NOTE — Progress Notes (Signed)
Checked in patient and she is not new. She said she just finished radiation.  All same.

## 2013-12-28 NOTE — Assessment & Plan Note (Signed)
Right breast high-grade DCIS with 3 SLN negative status post lumpectomy on 09/14/2013, grade 3, ER 0%, PR 0%  Pathology review: I discussed with the patient the details of pathology report including the fact that she has negative ER/PR status. I discussed the difference between DCIS and invasive ductal carcinoma. I agree with the current treatment plan for observation after finishing with radiation therapy. There is no role of antiestrogen therapy because she is ER/PR negative.  Recommendation: Observation and surveillance

## 2013-12-28 NOTE — Progress Notes (Signed)
CC: Dr. Stark Wagner, Dr. Dian Wagner  Follow-up note:  [name] returns today approximately 1 month following completion of radiation therapy in the management of her high-grade DCIS of the right breast.  She will return to work on December 4.  She does have slight fatigue but is otherwise doing well.  She saw Dr. Lindi Wagner this morning for discussion of chemo-preventative tamoxifen knowing that her hormone receptors were negative. They both decided not to take tamoxifen.  She tells me she plans on having follow-up mammography at [facility] in July 2016.  She will see Dr. Barry Wagner for a follow-up visit in one year.  Physical examination: Alert and oriented. Filed Vitals:   12/28/13 1018  BP: 138/62  Pulse: 62  Temp: 97.9 F (36.6 C)  Resp: 20   Head and neck examination: Grossly unremarkable.  Nodes: Without palpable cervical, supraclavicular, or axillary lymphadenopathy.  Breasts: There is residual hyperpigmentation of the right breast with patchy dry desquamation.  There is mild thickening of the right breast with small volume defect.  Left breast without masses or lesions.  Extremities: Without edema.  Impression: High-grade DCIS of the right breast.  She will have follow-up mammography at [facility] in July 2016.  She will see Dr. Barry Wagner for a follow-up visit in one year.  She will also maintain follow-up visits with Dr. Helane Wagner.  She is not scheduled to return to see me for a follow-up visit.

## 2014-01-04 ENCOUNTER — Ambulatory Visit: Payer: Self-pay | Admitting: Radiation Oncology

## 2014-01-06 ENCOUNTER — Encounter: Payer: Self-pay | Admitting: Internal Medicine

## 2014-01-19 ENCOUNTER — Other Ambulatory Visit: Payer: Self-pay

## 2014-01-19 ENCOUNTER — Encounter: Payer: Self-pay | Admitting: Internal Medicine

## 2014-01-19 MED ORDER — METOPROLOL TARTRATE 50 MG PO TABS: 50.0000 mg | ORAL_TABLET | ORAL | Status: AC

## 2014-04-04 ENCOUNTER — Other Ambulatory Visit: Payer: Self-pay | Admitting: Obstetrics and Gynecology

## 2014-04-05 LAB — CYTOLOGY - PAP

## 2014-05-30 ENCOUNTER — Other Ambulatory Visit (INDEPENDENT_AMBULATORY_CARE_PROVIDER_SITE_OTHER): Payer: BLUE CROSS/BLUE SHIELD

## 2014-05-30 ENCOUNTER — Encounter: Payer: Self-pay | Admitting: Internal Medicine

## 2014-05-30 ENCOUNTER — Ambulatory Visit (INDEPENDENT_AMBULATORY_CARE_PROVIDER_SITE_OTHER): Payer: BLUE CROSS/BLUE SHIELD | Admitting: Internal Medicine

## 2014-05-30 ENCOUNTER — Other Ambulatory Visit: Payer: Self-pay | Admitting: Internal Medicine

## 2014-05-30 VITALS — BP 152/86 | HR 58 | Temp 98.2°F | Resp 14 | Ht 61.0 in | Wt 173.5 lb

## 2014-05-30 DIAGNOSIS — Z0000 Encounter for general adult medical examination without abnormal findings: Secondary | ICD-10-CM | POA: Diagnosis not present

## 2014-05-30 DIAGNOSIS — I10 Essential (primary) hypertension: Secondary | ICD-10-CM

## 2014-05-30 DIAGNOSIS — E559 Vitamin D deficiency, unspecified: Secondary | ICD-10-CM | POA: Diagnosis not present

## 2014-05-30 DIAGNOSIS — Z0189 Encounter for other specified special examinations: Secondary | ICD-10-CM | POA: Diagnosis not present

## 2014-05-30 DIAGNOSIS — Z86010 Personal history of colonic polyps: Secondary | ICD-10-CM

## 2014-05-30 LAB — BASIC METABOLIC PANEL
BUN: 18 mg/dL (ref 6–23)
CO2: 32 mEq/L (ref 19–32)
Calcium: 9.7 mg/dL (ref 8.4–10.5)
Chloride: 106 mEq/L (ref 96–112)
Creatinine, Ser: 0.9 mg/dL (ref 0.40–1.20)
GFR: 81.23 mL/min (ref >60.00)
Glucose, Bld: 86 mg/dL (ref 70–99)
POTASSIUM: 4.2 meq/L (ref 3.5–5.1)
SODIUM: 140 meq/L (ref 135–145)

## 2014-05-30 LAB — HEPATIC FUNCTION PANEL
ALK PHOS: 92 U/L (ref 39–117)
ALT: 16 U/L (ref 0–35)
AST: 19 U/L (ref 0–37)
Albumin: 3.9 g/dL (ref 3.5–5.2)
Bilirubin, Direct: 0.1 mg/dL (ref 0.0–0.3)
TOTAL PROTEIN: 7.4 g/dL (ref 6.0–8.3)
Total Bilirubin: 0.6 mg/dL (ref 0.2–1.2)

## 2014-05-30 LAB — CBC WITH DIFFERENTIAL/PLATELET
BASOS PCT: 0.3 % (ref 0.0–3.0)
Basophils Absolute: 0 10*3/uL (ref 0.0–0.1)
EOS ABS: 0.1 10*3/uL (ref 0.0–0.7)
EOS PCT: 3.4 % (ref 0.0–5.0)
HEMATOCRIT: 39 % (ref 36.0–46.0)
HEMOGLOBIN: 13.1 g/dL (ref 12.0–15.0)
LYMPHS ABS: 1.7 10*3/uL (ref 0.7–4.0)
Lymphocytes Relative: 38.6 % (ref 12.0–46.0)
MCHC: 33.6 g/dL (ref 30.0–36.0)
MCV: 85.9 fl (ref 78.0–100.0)
MONOS PCT: 8.6 % (ref 3.0–12.0)
Monocytes Absolute: 0.4 10*3/uL (ref 0.1–1.0)
NEUTROS ABS: 2.1 10*3/uL (ref 1.4–7.7)
Neutrophils Relative %: 49.1 % (ref 43.0–77.0)
Platelets: 191 10*3/uL (ref 150.0–400.0)
RBC: 4.53 Mil/uL (ref 3.87–5.11)
RDW: 13.8 % (ref 11.5–15.5)
WBC: 4.3 10*3/uL (ref 4.0–10.5)

## 2014-05-30 LAB — TSH: TSH: 1.43 u[IU]/mL (ref 0.35–4.50)

## 2014-05-30 LAB — VITAMIN D 25 HYDROXY (VIT D DEFICIENCY, FRACTURES): VITD: 40.78 ng/mL (ref 30.00–100.00)

## 2014-05-30 MED ORDER — AMLODIPINE BESYLATE 5 MG PO TABS: 5.0000 mg | ORAL_TABLET | Freq: Every day | ORAL | Status: AC

## 2014-05-30 NOTE — Progress Notes (Signed)
Subjective:    Patient ID: Cynthia Wagner, female    DOB: 08/05/1950, 64 y.o.   MRN: [id number]  HPI  She is here for a physical;acute issues include labile blood pressure.  Her blood pressures been markedly variable ranging from 110/76-180/90.It was 190/100 at the time of the prep for colonoscopy.  She has been compliant with her medications without adverse effects. She's on a heart healthy diet. She walks approximately 20 minutes 2 days a week. She does have occasional palpitations and some exertional dyspnea. 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   She had a colon polyp removed; follow-up in 5 years was recommended.She has no active GI symptoms at this time.  She did have localized breast cancer in 2015 for which she received local radiation but no chemotherapy. No oncologic follow-up other than annual mammograms was recommended.    Review of Systems Chest pain, tachycardia,paroxysmal nocturnal dyspnea, claudication or edema are absent. Unexplained weight loss, abdominal pain, significant dyspepsia, dysphagia, melena, rectal bleeding, or persistently small caliber stools are denied.      Objective:   Physical Exam Gen.: Adequately nourished in appearance. Alert, appropriate and cooperative throughout exam. BMI:32.8 Appears younger than stated age  Head: Normocephalic without obvious abnormalities  Eyes: No corneal or conjunctival inflammation noted. Pupils equal round reactive to light and accommodation. Extraocular motion intact.  Ears: External  ear exam reveals no significant lesions or deformities. Canals clear .TMs normal. Hearing is grossly normal bilaterally. Nose: External nasal exam reveals no deformity or inflammation. Nasal mucosa are pink and moist. No lesions or exudates noted.   Mouth: Oral mucosa and oropharynx reveal no lesions or exudates. Teeth in good repair. Neck: No deformities, masses, or tenderness noted. Range of motion &  Thyroid normal. Lungs: Normal respiratory effort; chest expands symmetrically. Lungs are clear to auscultation without rales, wheezes, or increased work of breathing. Heart: Slow rate and regular rhythm. Normal S1 and S2. No gallop, click, or rub. No murmur. Abdomen: Bowel sounds normal; abdomen soft and nontender. No masses, organomegaly or hernias noted. Genitalia: as per Gyn                                  Musculoskeletal/extremities: No deformity or scoliosis noted of  the thoracic or lumbar spine.  No clubbing, cyanosis, edema, or significant extremity  deformity noted.  Range of motion normal . Tone & strength normal. Hand joints normal Fingernail  health good. Crepitus of L knee.   Able to lie down & sit up w/o help.  Negative SLR bilaterally Vascular: Carotid, radial artery, dorsalis pedis and  posterior tibial pulses are full and equal. No bruits present. Neurologic: Alert and oriented x3. Deep tendon reflexes symmetrical and normal.  Gait normal     Skin: Intact without suspicious lesions or rashes. Lymph: No cervical, axillary lymphadenopathy present. Psych: Mood and affect are normal. Normally interactive                                                                                      Assessment &  Plan:  #1 comprehensive physical exam; no acute findings  Plan: see Orders  & Recommendations

## 2014-05-30 NOTE — Progress Notes (Signed)
Pre visit review using our clinic review tool, if applicable. No additional management support is needed unless otherwise documented below in the visit note. 

## 2014-05-30 NOTE — Patient Instructions (Addendum)
  Your next office appointment will be determined based upon review of your pending labs.  Those instructions will be transmitted to you by My Chart    Critical results will be called.   Followup as needed for any active or acute issue. Please report any significant change in your symptoms.Minimal Blood Pressure Goal= AVERAGE < 140/90;  Ideal is an AVERAGE < 135/85. This AVERAGE should be calculated from @ least 5-7 BP readings taken @ different times of day on different days of week. You should not respond to isolated BP readings , but rather the AVERAGE for that week .Please bring your  blood pressure cuff to office visits to verify that it is reliable.It  can also be checked against the blood pressure device at the pharmacy.  Fill the  prescription for the BP medication if BP NOT @ goal based on  7 to 14 day average.

## 2014-06-01 LAB — NMR LIPOPROFILE WITH LIPIDS
Cholesterol, Total: 183 mg/dL (ref 100–199)
HDL Particle Number: 28.7 umol/L — ABNORMAL LOW (ref >=30.5)
HDL Size: 9 nm — ABNORMAL LOW (ref >=9.2)
HDL-C: 58 mg/dL (ref >39)
LDL CALC: 104 mg/dL — AB (ref 0–99)
LDL Particle Number: 1741 nmol/L — ABNORMAL HIGH (ref <1000)
LDL SIZE: 20.4 nm (ref >=20.8)
LP-IR SCORE: 30 (ref <=45)
Large HDL-P: 7.3 umol/L (ref >=4.8)
Large VLDL-P: <0.8 nmol/L (ref <=2.7)
SMALL LDL PARTICLE NUMBER: 1083 nmol/L — AB (ref <=527)
Triglycerides: 104 mg/dL (ref 0–149)
VLDL Size: 44.1 nm (ref <=46.6)

## 2014-07-05 ENCOUNTER — Other Ambulatory Visit: Payer: Self-pay | Admitting: General Surgery

## 2014-07-14 ENCOUNTER — Other Ambulatory Visit: Payer: Self-pay | Admitting: Internal Medicine

## 2014-07-23 ENCOUNTER — Ambulatory Visit (INDEPENDENT_AMBULATORY_CARE_PROVIDER_SITE_OTHER): Payer: BLUE CROSS/BLUE SHIELD | Admitting: Family Medicine

## 2014-07-23 ENCOUNTER — Encounter: Payer: Self-pay | Admitting: Family Medicine

## 2014-07-23 VITALS — BP 124/70 | HR 74 | Temp 98.4°F | Ht 61.0 in | Wt 172.8 lb

## 2014-07-23 DIAGNOSIS — W57XXXA Bitten or stung by nonvenomous insect and other nonvenomous arthropods, initial encounter: Secondary | ICD-10-CM | POA: Diagnosis not present

## 2014-07-23 DIAGNOSIS — T7840XA Allergy, unspecified, initial encounter: Secondary | ICD-10-CM | POA: Insufficient documentation

## 2014-07-23 DIAGNOSIS — S40862A Insect bite (nonvenomous) of left upper arm, initial encounter: Secondary | ICD-10-CM | POA: Diagnosis not present

## 2014-07-23 MED ORDER — METHYLPREDNISOLONE ACETATE 40 MG/ML IJ SUSP
40.0000 mg | Freq: Once | INTRAMUSCULAR | Status: AC
  Administered 2014-07-23: 40 mg via INTRAMUSCULAR

## 2014-07-23 NOTE — Progress Notes (Signed)
   Subjective:    Patient ID: Cynthia Wagner, female    DOB: April 29, 1950, 64 y.o.   MRN: [id number]  HPI   64 year old female with history of right breast cancer, HTN, IBS presents with new onset itching and swelling  in left inner arm x 1 week ago.  No bite noted. She was outdoors prior on vacation in [place].  Took Benadryl, ice, elevated.  Has not improved. Slightly red area , tender and warm to touch.   No lip swelling, no tounge swelling. No SOB. She feels well otherwise, tired from work.     Review of Systems  Constitutional: Positive for fatigue. Negative for fever.  HENT: Negative for ear pain.   Eyes: Negative for pain.  Respiratory: Negative for cough and shortness of breath.   Cardiovascular: Negative for chest pain.  Gastrointestinal: Negative for abdominal pain.       Objective:   Physical Exam  Constitutional: Vital signs are normal. She appears well-developed and well-nourished. She is cooperative.  Non-toxic appearance. She does not appear ill. No distress.  HENT:  Head: Normocephalic.  Right Ear: Hearing, tympanic membrane, external ear and ear canal normal. Tympanic membrane is not erythematous, not retracted and not bulging.  Left Ear: Hearing, tympanic membrane, external ear and ear canal normal. Tympanic membrane is not erythematous, not retracted and not bulging.  Nose: No mucosal edema or rhinorrhea. Right sinus exhibits no maxillary sinus tenderness and no frontal sinus tenderness. Left sinus exhibits no maxillary sinus tenderness and no frontal sinus tenderness.  Mouth/Throat: Uvula is midline, oropharynx is clear and moist and mucous membranes are normal.  Eyes: Conjunctivae, EOM and lids are normal. Pupils are equal, round, and reactive to light. Lids are everted and swept, no foreign bodies found.  Neck: Trachea normal and normal range of motion. Neck supple. Carotid bruit is not present. No thyroid mass and no thyromegaly present.  Cardiovascular:  Normal rate, regular rhythm, S1 normal, S2 normal, normal heart sounds, intact distal pulses and normal pulses.  Exam reveals no gallop and no friction rub.   No murmur heard. Pulmonary/Chest: Effort normal and breath sounds normal. No tachypnea. No respiratory distress. She has no decreased breath sounds. She has no wheezes. She has no rhonchi. She has no rales.  Abdominal: Soft. Normal appearance and bowel sounds are normal. There is no tenderness.  Neurological: She is alert.  Skin: Skin is warm, dry and intact. No rash noted.  Mild erythema, significant swelling, mild induration, no fluctuance to suggest abscess on left medical aspect of antecubital fossa.  Psychiatric: Her speech is normal and behavior is normal. Judgment and thought content normal. Her mood appears not anxious. Cognition and memory are normal. She does not exhibit a depressed mood.          Assessment & Plan:  Presumed insect bite with local allergic response. No sign of infection.  Treat with steroid IM and oral antihistamine.

## 2014-07-23 NOTE — Progress Notes (Signed)
Pre visit review using our clinic review tool, if applicable. No additional management support is needed unless otherwise documented below in the visit note. 

## 2014-07-23 NOTE — Addendum Note (Signed)
Addended by: Lowella Dandy on: 07/23/2014 10:10 AM   Modules accepted: Orders

## 2014-07-23 NOTE — Patient Instructions (Signed)
Elevate left arm, ice and use zyrtec .  Call if redness spreading or fever.

## 2014-08-22 ENCOUNTER — Telehealth: Payer: Self-pay | Admitting: Internal Medicine

## 2014-08-22 LAB — HM DEXA SCAN

## 2014-08-22 LAB — HM MAMMOGRAPHY: HM Mammogram: NEGATIVE

## 2014-08-22 NOTE — Telephone Encounter (Signed)
Please advise 

## 2014-08-22 NOTE — Telephone Encounter (Signed)
Cynthia Wagner is requesting a verbal for DEXA along with mammogram. Patient is insistent that she needs a DEXA because of breast cancer. Order needs to be placed in order for this to be done

## 2014-08-22 NOTE — Telephone Encounter (Signed)
Bone density was completed in July 2014. It was normal at all sites. It would not be needed for at least another year. When all sites are normal the interval between bone densities can be 3-5 years. Her gynecologist had been ordering the bone densities  Mammograms were done in September 2015; these were ordered by Dr. Valere Dross, radiation oncologist

## 2014-08-23 ENCOUNTER — Other Ambulatory Visit: Payer: Self-pay

## 2014-08-23 ENCOUNTER — Encounter: Payer: Self-pay | Admitting: Internal Medicine

## 2014-08-23 DIAGNOSIS — C50911 Malignant neoplasm of unspecified site of right female breast: Secondary | ICD-10-CM

## 2014-08-23 NOTE — Addendum Note (Signed)
Addended by: Stark Klein on: 08/23/2014 03:22 PM   Modules accepted: Orders

## 2014-08-24 NOTE — Telephone Encounter (Signed)
Spoke with pt, pt stated both of these procedures were done on 08/22/14. She stated that with her history of cancer and scoliosis that she wanted to have them done more often than recommended.

## 2014-08-26 ENCOUNTER — Other Ambulatory Visit: Payer: Self-pay

## 2014-08-26 DIAGNOSIS — C50911 Malignant neoplasm of unspecified site of right female breast: Secondary | ICD-10-CM

## 2014-11-11 ENCOUNTER — Ambulatory Visit (INDEPENDENT_AMBULATORY_CARE_PROVIDER_SITE_OTHER): Payer: BLUE CROSS/BLUE SHIELD

## 2014-11-11 DIAGNOSIS — Z23 Encounter for immunization: Secondary | ICD-10-CM

## 2014-12-19 ENCOUNTER — Other Ambulatory Visit: Payer: Self-pay | Admitting: General Surgery

## 2014-12-19 DIAGNOSIS — I890 Lymphedema, not elsewhere classified: Secondary | ICD-10-CM

## 2014-12-21 ENCOUNTER — Ambulatory Visit: Payer: BLUE CROSS/BLUE SHIELD | Attending: General Surgery | Admitting: Physical Therapy

## 2014-12-21 DIAGNOSIS — I890 Lymphedema, not elsewhere classified: Secondary | ICD-10-CM

## 2014-12-21 NOTE — Therapy (Signed)
[place], [place], [address] Phone: [phone]   Fax:  [phone]  Physical Therapy Evaluation  Patient Details  Name: Cynthia Wagner MRN: [id number] Date of Birth: November 26, 1950 Referring Provider: Dr. Stark Klein  Encounter Date: 12/21/2014      PT End of Session - 12/21/14 2035    Visit Number 1   Number of Visits 5   Date for PT Re-Evaluation 01/20/15   PT Start Time 1118   PT Stop Time 1205   PT Time Calculation (min) 47 min   Activity Tolerance Patient tolerated treatment well   Behavior During Therapy [facility] for tasks assessed/performed      Past Medical History  Diagnosis Date  . IBS (irritable bowel syndrome)   . Sleep apnea     uses CPAP most nights  . Scoliosis   . Hypertension     under control with med., has been on med. since 2006  . Breast cancer 09/2013    right  . Complication of anesthesia     states is sensitive to narcotics  . PONV (postoperative nausea and vomiting)   . Dental bridge present     left upper; right upper and lower  . History of radiation therapy 10/27/13- 11/29/13    right breast 4800 cGy 24 sessions    Past Surgical History  Procedure Laterality Date  . Carpal tunnel release Bilateral 1991  . Colonoscopy  2003; 2006  . Vulva /perineum biopsy    . Hemorrhoid surgery    . Esophagogastroduodenoscopy  10/11/2003  . Hammer toe surgery Left 06/25/2011  . Cesarean section      x 2  . Breast lumpectomy with needle localization and axillary sentinel lymph node bx Right 09/14/2013    Procedure: BREAST LUMPECTOMY WITH NEEDLE LOCALIZATION AND AXILLARY SENTINEL LYMPH NODE BX;  Surgeon: Stark Klein, MD;  Location: [facility];  Service: General;  Laterality: Right;  . Vaginal hysterectomy  1995    ovaries intact, fibroids  . Colonoscopy with polypectomy  12/2013    Dr Collene Mares; due 2021    There were no vitals filed for this visit.  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  Swelling started a couple months ago.  HTN controlled with meds and diet; IBS; sleep apnea and uses CPAP.  Some back problems and sees a chriopractor.   Patient Stated Goals work on right breast swelling   Currently in Pain? No/denies            [facility] PT Assessment - 12/21/14 0001    Assessment   Medical Diagnosis right DCIS with lumpectomy and radiation, sentinel node biopsy   Referring Provider Dr. Stark Klein   Onset Date/Surgical Date 09/04/13  approx.   Hand Dominance Right   Precautions   Precautions Other (comment)   Precaution Comments cancer precautions   Restrictions   Weight Bearing Restrictions No   Balance Screen   Has the patient fallen in the past 6 months Yes   How many times? 1   Has the patient had a decrease in activity level because of a fear of falling?  No   Is the patient reluctant to leave their home because of a  fear of falling?  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   Leisure has a Dietitian and uses it twice a week for 15 minutes, but would like to do more   Cognition   Overall Cognitive Status Within Functional Limits for tasks assessed   Observation/Other Assessments   Observations right breast is still discolored from radiation; tissue feels firmer than left   Posture/Postural Control   Posture/Postural Control No significant limitations   ROM / Strength   AROM / PROM / Strength AROM;Strength   AROM   Overall AROM   Within functional limits for tasks performed  both shoulders   Strength   Overall Strength Within functional limits for tasks performed  both shoulders   Palpation   Palpation comment right breast is firmer than left   Ambulation/Gait   Ambulation/Gait Yes   Ambulation/Gait Assistance 7: Independent           LYMPHEDEMA/ONCOLOGY QUESTIONNAIRE - 12/21/14 1138    Type   Cancer Type right DCIS   Surgeries   Lumpectomy Date 09/04/13  approx.   Treatment   Past Radiation Treatment Yes   Date 11/29/13   What other symptoms do you have   Are you Having Heaviness or Tightness Yes  right breast   Are you having Pain No   Are you having pitting edema No   Is it Hard or Difficult finding clothes that fit Yes  bras   [place] Sign No   Lymphedema Assessments   Lymphedema Assessments Upper extremities   Right Upper Extremity Lymphedema   15 cm Proximal to Olecranon Process 30.7 cm   Olecranon Process 27.2 cm   15 cm Proximal to Ulnar Styloid Process 27.3 cm   Just Proximal to Ulnar Styloid Process 17.5 cm   Across Hand at [employer] 20.8 cm   At [place] of 2nd Digit 7.2 cm   Other 107.6 cm. around chest at approx. 5 cm. inferior to right axilla, on exhale   Left Upper Extremity Lymphedema   15 cm Proximal to Olecranon Process 30.7 cm   Olecranon Process 26.6 cm   15 cm Proximal to Ulnar Styloid Process 26.7 cm   Just Proximal to Ulnar Styloid Process 17.4 cm   Across Hand at [employer] 20.2 cm   At [place] of 2nd Digit 7.2 cm                [facility] Adult PT Treatment/Exercise - 12/21/14 0001    Self-Care   Self-Care Other Self-Care Comments   Other Self-Care Comments  Gave patient 1/2 inch thick gray foam rectangle in stockinette and suggested she place it inferior to axilla to see if it reduced swelling and discomfort there.  Briefly discussed compression bras.                        [facility] Goals - 12/21/14 2040    CC Long Term Goal   #1   Title Patient will be independent in self-manual lymph drainage for right breast.   Time 3   Period Weeks   Status New   CC Long Term Goal  #2   Title Patient will be  knowledgeable about where and how to obtain appropriate compression garments for right breast.   Time 3   Period Weeks   Status New   CC Long Term Goal  #3   Title Patient will be knowledgeable about infection risk and lymphedema risk reduction.   Time 3   Period Weeks   Status New            Plan - 12/21/14 2036    Clinical Impression Statement Patient is s/p lumpectomy and radiation for right breast DCIS and has developed swelling in that breast. Arm circumference measurements show little difference between the two arms; breast cannot be measured in a meaningful way, but breast tissue and right flank just inferior to axilla is firmer than the left and does appear swollen.   Pt will benefit from skilled therapeutic intervention in order to improve on the following deficits Increased edema   Rehab Potential Excellent   PT Frequency 1x / week   PT Duration 2 weeks  to 4 weeks prn   PT Treatment/Interventions Patient/family education;DME Instruction;Manual lymph drainage;Manual techniques;ADLs/Self Care Home Management   PT Next Visit Plan Begin manual lymph drainage with plan to instruct patient in same; discuss compression bras (patient is to bring in bras that she has gotten which are not comfortable to her, for [place] to see what those are); assess benefit of gray foam pad   Consulted and Agree with Plan of Care Patient         Problem List Patient Active Problem List   Diagnosis Date Noted  . Insect bite of arm, left 07/23/2014  . Allergic reaction 07/23/2014  . History of colonic polyps 05/30/2014  . Breast cancer of lower-outer quadrant of right female breast ([place]) 09/08/2013  . Unspecified menopausal and postmenopausal disorder 08/03/2012  . Lichen planus 99991111  . UNSPECIFIED VITAMIN D  DEFICIENCY 04/23/2010  . HYPERLIPIDEMIA 11/02/2007  . UNSPECIFIED ANEMIA 11/02/2007  . SICKLE CELL TRAIT 03/30/2007  . HYPERTENSION 03/30/2007  . GERD 03/30/2007  . IBS 03/30/2007  . SLEEP APNEA 03/30/2007  . ABNORMAL ELECTROCARDIOGRAM 03/30/2007  . History of positive PPD, untreated 03/30/2007    SALISBURY,DONNA 12/21/2014, 8:44 PM  [place], [place], [address] Phone: [phone]   Fax:  [phone]  Name: Cynthia Wagner MRN: [id number] Date of Birth: April 26, 1950   Serafina Royals, PT 12/21/2014 8:44 PM

## 2014-12-23 ENCOUNTER — Ambulatory Visit: Payer: BLUE CROSS/BLUE SHIELD | Admitting: Physical Therapy

## 2014-12-23 DIAGNOSIS — I890 Lymphedema, not elsewhere classified: Secondary | ICD-10-CM

## 2014-12-23 NOTE — Patient Instructions (Signed)
[email or web address] Lymphatic flow series with Shoosh Lettick Crotzer 

## 2014-12-23 NOTE — Therapy (Signed)
[place], [place], [address] Phone: [phone]   Fax:  [phone]  Physical Therapy Treatment  Patient Details  Name: Cynthia Wagner MRN: [id number] Date of Birth: Mar 04, 1950 Referring Provider: Dr. Stark Klein  Encounter Date: 12/23/2014    Past Medical History  Diagnosis Date  . IBS (irritable bowel syndrome)   . Sleep apnea     uses CPAP most nights  . Scoliosis   . Hypertension     under control with med., has been on med. since 2006  . Breast cancer 09/2013    right  . Complication of anesthesia     states is sensitive to narcotics  . PONV (postoperative nausea and vomiting)   . Dental bridge present     left upper; right upper and lower  . History of radiation therapy 10/27/13- 11/29/13    right breast 4800 cGy 24 sessions    Past Surgical History  Procedure Laterality Date  . Carpal tunnel release Bilateral 1991  . Colonoscopy  2003; 2006  . Vulva /perineum biopsy    . Hemorrhoid surgery    . Esophagogastroduodenoscopy  10/11/2003  . Hammer toe surgery Left 06/25/2011  . Cesarean section      x 2  . Breast lumpectomy with needle localization and axillary sentinel lymph node bx Right 09/14/2013    Procedure: BREAST LUMPECTOMY WITH NEEDLE LOCALIZATION AND AXILLARY SENTINEL LYMPH NODE BX;  Surgeon: Stark Klein, MD;  Location: [facility];  Service: General;  Laterality: Right;  . Vaginal hysterectomy  1995    ovaries intact, fibroids  . Colonoscopy with polypectomy  12/2013    Dr Collene Mares; due 2021    There were no vitals filed for this visit.  Visit Diagnosis:  Lymphedema of breast      Subjective Assessment - 12/23/14 1331    Subjective Pt reports she is leaving for [facility] and wants to know if she should use compression on the fight. 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  Swelling started a couple months ago.  HTN controlled with meds and diet; IBS; sleep apnea and uses CPAP.  Some back problems and sees a chriopractor.   Patient Stated Goals work on right breast swelling   Currently in Pain? No/denies                         [facility] Adult PT Treatment/Exercise - 12/23/14 0001    Manual Therapy   Manual Lymphatic Drainage (MLD) Instruction and performance of short neck, superficial and deep abdominals, right inguinal nodes, right axillopinguinal anastamosis, left axially nodes, anterior interaxiallary anastamosis, left breast and  abdominal area in supine and left sidelying.  Pt expressed beginning understanding.                 PT Education - 12/23/14 1332    Education provided Yes   Education Details weblink for lymphatic yoga, DVD for self manual lymph drianage, phone numbers for [employer] and [facility] to see about sleeves    Person(s) Educated Patient   Methods Explanation;Handout   Comprehension Verbalized understanding                [place] - 12/21/14 2040    CC Long Term Goal  #1  Title Patient will be independent in self-manual lymph drainage for right breast.   Time 3   Period Weeks   Status New   CC Long Term Goal  #2   Title Patient will be knowledgeable about where and how to obtain appropriate compression garments for right breast.   Time 3   Period Weeks   Status New   CC Long Term Goal  #3   Title Patient will be knowledgeable about infection risk and lymphedema risk reduction.   Time 3   Period Weeks   Status New            Plan - 12/23/14 1336    Clinical Impression Statement Right breast palpable soft with minimal fullness around incision. Pt encouraged to use foam patch in bra while on flight and to do self manual lymph drainage as she can next week on vacation.    Pt will benefit from skilled therapeutic  intervention in order to improve on the following deficits Increased edema   PT Next Visit Plan Review self manual lymph drianage and compression. Possibly instruct in strength ABC program as pt is interested in trying to decrease her likelihood of developing arm  lymphedem with exercise    Consulted and Agree with Plan of Care Patient        Problem List Patient Active Problem List   Diagnosis Date Noted  . Insect bite of arm, left 07/23/2014  . Allergic reaction 07/23/2014  . History of colonic polyps 05/30/2014  . Breast cancer of lower-outer quadrant of right female breast ([place]) 09/08/2013  . Unspecified menopausal and postmenopausal disorder 08/03/2012  . Lichen planus 99991111  . UNSPECIFIED VITAMIN D DEFICIENCY 04/23/2010  . HYPERLIPIDEMIA 11/02/2007  . UNSPECIFIED ANEMIA 11/02/2007  . SICKLE CELL TRAIT 03/30/2007  . HYPERTENSION 03/30/2007  . GERD 03/30/2007  . IBS 03/30/2007  . SLEEP APNEA 03/30/2007  . ABNORMAL ELECTROCARDIOGRAM 03/30/2007  . History of positive PPD, untreated 03/30/2007   Donato Heinz. Owens Shark, PT  12/23/2014, 1:39 PM  [place], [place], [address] Phone: [phone]   Fax:  [phone]  Name: SHAYNE LANDSVERK MRN: [id number] Date of Birth: 1950/07/16

## 2015-01-05 ENCOUNTER — Ambulatory Visit: Payer: BLUE CROSS/BLUE SHIELD | Attending: General Surgery

## 2015-01-05 DIAGNOSIS — I890 Lymphedema, not elsewhere classified: Secondary | ICD-10-CM | POA: Diagnosis present

## 2015-01-05 NOTE — Therapy (Signed)
[place], [place], [address] Phone: [phone]   Fax:  [phone]  Physical Therapy Treatment  Patient Details  Name: Cynthia Wagner MRN: [id number] Date of Birth: 1950/10/22 Referring Provider: Dr. Stark Klein  Encounter Date: 01/05/2015      PT End of Session - 01/05/15 0931    Visit Number 3   Number of Visits 5   Date for PT Re-Evaluation 01/20/15   PT Start Time 0846   PT Stop Time 0932   PT Time Calculation (min) 46 min   Activity Tolerance Patient tolerated treatment well   Behavior During Therapy [facility] for tasks assessed/performed      Past Medical History  Diagnosis Date  . IBS (irritable bowel syndrome)   . Sleep apnea     uses CPAP most nights  . Scoliosis   . Hypertension     under control with med., has been on med. since 2006  . Breast cancer 09/2013    right  . Complication of anesthesia     states is sensitive to narcotics  . PONV (postoperative nausea and vomiting)   . Dental bridge present     left upper; right upper and lower  . History of radiation therapy 10/27/13- 11/29/13    right breast 4800 cGy 24 sessions    Past Surgical History  Procedure Laterality Date  . Carpal tunnel release Bilateral 1991  . Colonoscopy  2003; 2006  . Vulva /perineum biopsy    . Hemorrhoid surgery    . Esophagogastroduodenoscopy  10/11/2003  . Hammer toe surgery Left 06/25/2011  . Cesarean section      x 2  . Breast lumpectomy with needle localization and axillary sentinel lymph node bx Right 09/14/2013    Procedure: BREAST LUMPECTOMY WITH NEEDLE LOCALIZATION AND AXILLARY SENTINEL LYMPH NODE BX;  Surgeon: Stark Klein, MD;  Location: [facility];  Service: General;  Laterality: Right;  . Vaginal hysterectomy  1995    ovaries intact, fibroids  . Colonoscopy with polypectomy  12/2013    Dr Collene Mares; due 2021    There were no vitals filed for this visit.  Visit Diagnosis:   Lymphedema of breast      Subjective Assessment - 01/05/15 0850    Subjective Have been doing the massage, but not as much as I'd like due to our septic system has been messing up so I've been dealing with that. I wore my sleeve when I flew to [facility].    Currently in Pain? No/denies               LYMPHEDEMA/ONCOLOGY QUESTIONNAIRE - 01/05/15 [id number]    Right Upper Extremity Lymphedema   15 cm Proximal to Olecranon Process 31.1 cm   Olecranon Process 27.3 cm   15 cm Proximal to Ulnar Styloid Process 27.3 cm   Just Proximal to Ulnar Styloid Process 16.9 cm   Across Hand at [employer] 20.9 cm   At [place] of 2nd Digit 7.3 cm   Other 103.8 cm. around chest at approx. 5 cm. inferior to right axilla, on exhale                  [facility] Adult PT Treatment/Exercise - 01/05/15 0001    Manual Therapy   Manual Lymphatic Drainage (MLD) Review and performance of short neck, superficial and deep abdominals, right inguinal nodes, right axillo-inguinal anastamosis, left axially nodes, anterior interaxiallary anastamosis, left breast and  abdominal area  in supine and left sidelying.  Pt expressed beginning understanding.                         [facility] Goals - 01/05/15 [phone]    CC Long Term Goal  #1   Title Patient will be independent in self-manual lymph drainage for right breast.   Status Achieved   CC Long Term Goal  #2   Title Patient will be knowledgeable about where and how to obtain appropriate compression garments for right breast.   Status Achieved   CC Long Term Goal  #3   Title Patient will be knowledgeable about infection risk and lymphedema risk reduction.   Status On-going            Plan - 01/05/15 0934    Clinical Impression Statement Rt breast continues to be palpably soft with minimal fullness around incision. Pt has been compliant with self manual lymph drainage and wearing compression foam which she reports has been very helpful.  Her upper arm measurements was increased slightly by 0.4 cm possibly from pt not wearing her sleeve after her flight as she removed it upon landing both times and was pulling her heavy luggage through the airport.    Pt will benefit from skilled therapeutic intervention in order to improve on the following deficits Increased edema   Rehab Potential Excellent   PT Frequency 1x / week   PT Duration 2 weeks  to 4 weeks prn   PT Treatment/Interventions Patient/family education;DME Instruction;Manual lymph drainage;Manual techniques;ADLs/Self Care Home Management   PT Next Visit Plan Instruct in [place] next visit and pt to determine after that if she needs to come for review 1 more session.    Recommended Other Services Educated pt about compression bra and can get at [place] and Agree with Plan of Care Patient        Problem List Patient Active Problem List   Diagnosis Date Noted  . Insect bite of arm, left 07/23/2014  . Allergic reaction 07/23/2014  . History of colonic polyps 05/30/2014  . Breast cancer of lower-outer quadrant of right female breast ([place]) 09/08/2013  . Unspecified menopausal and postmenopausal disorder 08/03/2012  . Lichen planus [id number]  . UNSPECIFIED VITAMIN D DEFICIENCY 04/23/2010  . HYPERLIPIDEMIA 11/02/2007  . UNSPECIFIED ANEMIA 11/02/2007  . SICKLE CELL TRAIT 03/30/2007  . HYPERTENSION 03/30/2007  . GERD 03/30/2007  . IBS 03/30/2007  . SLEEP APNEA 03/30/2007  . ABNORMAL ELECTROCARDIOGRAM 03/30/2007  . History of positive PPD, untreated 03/30/2007    Otelia Limes, PTA 01/05/2015, 9:37 AM  [place], [place], [address] Phone: [phone]   Fax:  [phone]  Name: Cynthia Wagner MRN: [id number] Date of Birth: 1950/09/25

## 2015-01-06 ENCOUNTER — Ambulatory Visit: Payer: BLUE CROSS/BLUE SHIELD | Admitting: Physical Therapy

## 2015-01-06 DIAGNOSIS — I890 Lymphedema, not elsewhere classified: Secondary | ICD-10-CM

## 2015-01-06 NOTE — Patient Instructions (Signed)
[email or web address] Courses Online Strength After Breast Cancer Look at the right of the page for Lymphedema Education Session  

## 2015-01-06 NOTE — Therapy (Signed)
[place], [place], [address] Phone: [phone]   Fax:  [phone]  Physical Therapy Treatment  Patient Details  Name: Cynthia Wagner MRN: [id number] Date of Birth: August 14, 1950 Referring Provider: Dr. Stark Klein  Encounter Date: 01/06/2015      PT End of Session - 01/06/15 1044    Visit Number 4   Number of Visits 5   Date for PT Re-Evaluation 01/20/15   PT Start Time 0845   PT Stop Time 0930   PT Time Calculation (min) 45 min   Activity Tolerance Patient tolerated treatment well   Behavior During Therapy [facility] for tasks assessed/performed      Past Medical History  Diagnosis Date  . IBS (irritable bowel syndrome)   . Sleep apnea     uses CPAP most nights  . Scoliosis   . Hypertension     under control with med., has been on med. since 2006  . Breast cancer 09/2013    right  . Complication of anesthesia     states is sensitive to narcotics  . PONV (postoperative nausea and vomiting)   . Dental bridge present     left upper; right upper and lower  . History of radiation therapy 10/27/13- 11/29/13    right breast 4800 cGy 24 sessions    Past Surgical History  Procedure Laterality Date  . Carpal tunnel release Bilateral 1991  . Colonoscopy  2003; 2006  . Vulva /perineum biopsy    . Hemorrhoid surgery    . Esophagogastroduodenoscopy  10/11/2003  . Hammer toe surgery Left 06/25/2011  . Cesarean section      x 2  . Breast lumpectomy with needle localization and axillary sentinel lymph node bx Right 09/14/2013    Procedure: BREAST LUMPECTOMY WITH NEEDLE LOCALIZATION AND AXILLARY SENTINEL LYMPH NODE BX;  Surgeon: Stark Klein, MD;  Location: [place] [facility];  Service: General;  Laterality: Right;  . Vaginal hysterectomy  1995    ovaries intact, fibroids  . Colonoscopy with polypectomy  12/2013    Dr Collene Mares; due 2021    There were no vitals filed for this visit.  Visit Diagnosis:   Lymphedema of breast      Subjective Assessment - 01/06/15 0853    Subjective I think I'm dong great   Patient Stated Goals work on right breast swelling               LYMPHEDEMA/ONCOLOGY QUESTIONNAIRE - 01/05/15 0922    Right Upper Extremity Lymphedema   15 cm Proximal to Olecranon Process 31.1 cm   Olecranon Process 27.3 cm   15 cm Proximal to Ulnar Styloid Process 27.3 cm   Just Proximal to Ulnar Styloid Process 16.9 cm   Across Hand at [employer] 20.9 cm   At [place] of 2nd Digit 7.3 cm   Other 103.8 cm. around chest at approx. 5 cm. inferior to right axilla, on exhale                  [facility] Adult PT Treatment/Exercise - 01/06/15 0001    Self-Care   Other Self-Care Comments  reinforced community exercie program opportunities and issued link for klosetraining lymphedema education session in prepartation for Strength ABC program instruction    Manual Therapy   Manual Lymphatic Drainage (MLD) Review and performance of short neck, superficial and deep abdominals, right inguinal nodes, right axillo-inguinal anastamosis, left axially nodes, anterior interaxiallary anastamosis, left breast and  abdominal  area in supine and left sidelying.                 PT Education - 01/06/15 0931    Education provided Yes   Education Details Issued information about LiveStrong Program and Cancer Fit at [facility]. weblink for [email or web address] lymphedema education session    Person(s) Educated Patient   Methods Explanation;Demonstration;Handout   Comprehension Verbalized understanding                [place] - 01/05/15 [phone]    CC Long Term Goal  #1   Title Patient will be independent in self-manual lymph drainage for right breast.   Status Achieved   CC Long Term Goal  #2   Title Patient will be knowledgeable about where and how to obtain appropriate compression garments for right breast.   Status Achieved   CC Long Term  Goal  #3   Title Patient will be knowledgeable about infection risk and lymphedema risk reduction.   Status On-going            Plan - 01/06/15 1045    Clinical Impression Statement Pt requested session of manual lymph drainage for breast today for review and was very interested in information about community exercise programs and the education session on [email or web address].  she would like to learn the Strenght ABC program next session and that will likely be her last session    PT Next Visit Plan Instruct in [place] next visit and issue handouts         Problem List Patient Active Problem List   Diagnosis Date Noted  . Insect bite of arm, left 07/23/2014  . Allergic reaction 07/23/2014  . History of colonic polyps 05/30/2014  . Breast cancer of lower-outer quadrant of right female breast ([place]) 09/08/2013  . Unspecified menopausal and postmenopausal disorder 08/03/2012  . Lichen planus [id number]  . UNSPECIFIED VITAMIN D DEFICIENCY 04/23/2010  . HYPERLIPIDEMIA 11/02/2007  . UNSPECIFIED ANEMIA 11/02/2007  . SICKLE CELL TRAIT 03/30/2007  . HYPERTENSION 03/30/2007  . GERD 03/30/2007  . IBS 03/30/2007  . SLEEP APNEA 03/30/2007  . ABNORMAL ELECTROCARDIOGRAM 03/30/2007  . History of positive PPD, untreated 03/30/2007   Donato Heinz. [place], PT   01/06/2015, 10:47 AM  [place], [place], [address] Phone: [phone]   Fax:  [phone]  Name: Cynthia Wagner MRN: [id number] Date of Birth: [birth year]

## 2015-01-09 ENCOUNTER — Ambulatory Visit: Payer: BLUE CROSS/BLUE SHIELD | Admitting: Physical Therapy

## 2015-01-09 DIAGNOSIS — I890 Lymphedema, not elsewhere classified: Secondary | ICD-10-CM

## 2015-01-09 NOTE — Therapy (Signed)
[place], [place], [address] Phone: [phone]   Fax:  [phone]  Physical Therapy Treatment  Patient Details  Name: Cynthia Wagner MRN: [id number] Date of Birth: 1950/11/26 Referring Provider: Dr. Stark Klein  Encounter Date: 01/09/2015      PT End of Session - 01/09/15 2117    Visit Number 5   Number of Visits 5   PT Start Time 1350   PT Stop Time 1439   PT Time Calculation (min) 49 min   Activity Tolerance Patient tolerated treatment well   Behavior During Therapy [facility] for tasks assessed/performed      Past Medical History  Diagnosis Date  . IBS (irritable bowel syndrome)   . Sleep apnea     uses CPAP most nights  . Scoliosis   . Hypertension     under control with med., has been on med. since 2006  . Breast cancer 09/2013    right  . Complication of anesthesia     states is sensitive to narcotics  . PONV (postoperative nausea and vomiting)   . Dental bridge present     left upper; right upper and lower  . History of radiation therapy 10/27/13- 11/29/13    right breast 4800 cGy 24 sessions    Past Surgical History  Procedure Laterality Date  . Carpal tunnel release Bilateral 1991  . Colonoscopy  2003; 2006  . Vulva /perineum biopsy    . Hemorrhoid surgery    . Esophagogastroduodenoscopy  10/11/2003  . Hammer toe surgery Left 06/25/2011  . Cesarean section      x 2  . Breast lumpectomy with needle localization and axillary sentinel lymph node bx Right 09/14/2013    Procedure: BREAST LUMPECTOMY WITH NEEDLE LOCALIZATION AND AXILLARY SENTINEL LYMPH NODE BX;  Surgeon: Stark Klein, MD;  Location: [facility];  Service: General;  Laterality: Right;  . Vaginal hysterectomy  1995    ovaries intact, fibroids  . Colonoscopy with polypectomy  12/2013    Dr Collene Mares; due 2021    There were no vitals filed for this visit.  Visit Diagnosis:  Lymphedema of breast       Subjective Assessment - 01/09/15 1354    Subjective Had trouble with the foam pad in thicker stockinette, which got rolled up.  The foam piece in stockinette helps a lot.   Currently in Pain? No/denies                         [facility] Adult PT Treatment/Exercise - 01/09/15 0001    Self-Care   Other Self-Care Comments  discussed lymphedema risk reduction; starting with strength ABC program and progressing to gym or other workouts; also briefly discussed compression bras further   Exercises   Exercises Other Exercises   Other Exercises  Instructed patient in entire strength ABC program and had her perform all stretches, core strengthening, and resistance exercises, at least for one extremity and sometimes both.                PT Education - 01/09/15 2116    Education provided Yes   Education Details Strength ABC program   Person(s) Educated Patient   Methods Explanation;Demonstration;Verbal cues;Handout   Comprehension Verbalized understanding;Returned demonstration                [facility] Goals - 01/09/15 2121    CC Long Term Goal  #1   Title  Patient will be independent in self-manual lymph drainage for right breast.   Status Achieved   CC Long Term Goal  #2   Title Patient will be knowledgeable about where and how to obtain appropriate compression garments for right breast.   Status Achieved   CC Long Term Goal  #3   Title Patient will be knowledgeable about infection risk and lymphedema risk reduction.   Status Achieved            Plan - 01/09/15 2118    Clinical Impression Statement Patient did well today learning strength ABC program and understanding the benefit of starting low and gradually increasing an exercise program.  She does feel ready to try to continue on her own what she has learned in therapy.   Pt will benefit from skilled therapeutic intervention in order to improve on the following deficits Increased edema   Rehab  Potential Excellent   PT Treatment/Interventions Therapeutic exercise;Patient/family education   PT Next Visit Plan None; patient is ready to be discharged.   PT Home Exercise Plan Strength ABC program.        Problem List Patient Active Problem List   Diagnosis Date Noted  . Insect bite of arm, left 07/23/2014  . Allergic reaction 07/23/2014  . History of colonic polyps 05/30/2014  . Breast cancer of lower-outer quadrant of right female breast ([place]) 09/08/2013  . Unspecified menopausal and postmenopausal disorder 08/03/2012  . Lichen planus 38/18/4037  . UNSPECIFIED VITAMIN D DEFICIENCY 04/23/2010  . HYPERLIPIDEMIA 11/02/2007  . UNSPECIFIED ANEMIA 11/02/2007  . SICKLE CELL TRAIT 03/30/2007  . HYPERTENSION 03/30/2007  . GERD 03/30/2007  . IBS 03/30/2007  . SLEEP APNEA 03/30/2007  . ABNORMAL ELECTROCARDIOGRAM 03/30/2007  . History of positive PPD, untreated 03/30/2007    SALISBURY,DONNA 01/09/2015, 9:27 PM  [place], [place], [address] Phone: [phone]   Fax:  [phone]  Name: Cynthia Wagner MRN: [id number] Date of Birth: 1950/04/12    PHYSICAL THERAPY DISCHARGE SUMMARY  Visits from Start of Care: 5  Current functional level related to goals / functional outcomes: Goals met as noted above.   Remaining deficits: Patient will need to continue self-care for lymphedema risk reduction and control of breast swelling.   Education / Equipment: Self manual lymph drainage, strength ABC exercise program.  Plan: Patient agrees to discharge.  Patient goals were met. Patient is being discharged due to meeting the stated rehab goals.  She is ready to continue with independent self-care.?????  Serafina Royals, PT 01/09/2015 9:27 PM

## 2015-01-09 NOTE — Patient Instructions (Signed)
[email or web address] Courses Online Strength After Breast Cancer Look at the right of the page for Lymphedema Education Session  

## 2015-01-11 ENCOUNTER — Ambulatory Visit: Payer: BLUE CROSS/BLUE SHIELD | Admitting: Physical Therapy

## 2015-01-13 ENCOUNTER — Encounter: Payer: BLUE CROSS/BLUE SHIELD | Admitting: Physical Therapy

## 2015-01-16 ENCOUNTER — Ambulatory Visit: Payer: BLUE CROSS/BLUE SHIELD | Admitting: Physical Therapy

## 2015-01-20 ENCOUNTER — Encounter: Payer: BLUE CROSS/BLUE SHIELD | Admitting: Physical Therapy

## 2015-02-02 ENCOUNTER — Ambulatory Visit: Payer: BLUE CROSS/BLUE SHIELD | Admitting: Physical Therapy

## 2015-02-03 ENCOUNTER — Ambulatory Visit: Payer: BLUE CROSS/BLUE SHIELD | Admitting: Physical Therapy

## 2015-08-28 ENCOUNTER — Other Ambulatory Visit: Payer: Self-pay | Admitting: Radiology

## 2016-05-27 ENCOUNTER — Ambulatory Visit: Payer: Medicare Other | Attending: General Surgery | Admitting: Physical Therapy

## 2016-05-27 DIAGNOSIS — M25511 Pain in right shoulder: Secondary | ICD-10-CM | POA: Diagnosis present

## 2016-05-27 DIAGNOSIS — M25611 Stiffness of right shoulder, not elsewhere classified: Secondary | ICD-10-CM | POA: Insufficient documentation

## 2016-05-27 NOTE — Therapy (Signed)
[place], [place], [address] Phone: [phone]   Fax:  [phone]  Physical Therapy Evaluation  Patient Details  Name: Cynthia Wagner MRN: [id number] Date of Birth: [birth year] Referring Provider: Dr. Stark Klein  Encounter Date: 05/27/2016      PT End of Session - 05/27/16 2039    Visit Number 1   Number of Visits 9   Date for PT Re-Evaluation 07/04/16   PT Start Time 7353   PT Stop Time 1343   PT Time Calculation (min) 38 min   Activity Tolerance Patient tolerated treatment well   Behavior During Therapy [facility] for tasks assessed/performed      Past Medical History:  Diagnosis Date  . Breast cancer 09/2013   right  . Complication of anesthesia    states is sensitive to narcotics  . Dental bridge present    left upper; right upper and lower  . History of radiation therapy 10/27/13- 11/29/13   right breast 4800 cGy 24 sessions  . Hypertension    under control with med., has been on med. since 2006  . IBS (irritable bowel syndrome)   . PONV (postoperative nausea and vomiting)   . Scoliosis   . Sleep apnea    uses CPAP most nights    Past Surgical History:  Procedure Laterality Date  . BREAST LUMPECTOMY WITH NEEDLE LOCALIZATION AND AXILLARY SENTINEL LYMPH NODE BX Right 09/14/2013   Procedure: BREAST LUMPECTOMY WITH NEEDLE LOCALIZATION AND AXILLARY SENTINEL LYMPH NODE BX;  Surgeon: Stark Klein, MD;  Location: [facility];  Service: General;  Laterality: Right;  . CARPAL TUNNEL RELEASE Bilateral 1991  . CESAREAN SECTION     x 2  . COLONOSCOPY  2003; 2006  . colonoscopy with polypectomy  12/2013   Dr Collene Mares; due 2021  . ESOPHAGOGASTRODUODENOSCOPY  10/11/2003  . HAMMER TOE SURGERY Left 06/25/2011  . HEMORRHOID SURGERY    . VAGINAL HYSTERECTOMY  1995   ovaries intact, fibroids  . VULVA /PERINEUM BIOPSY      There were no vitals filed for this visit.       Subjective  Assessment - 05/27/16 1310    Subjective Goes to a chiropractor on and off for scoliosis; chiropractor asked her to put right arm behind her back and she couldn't; also had trouble lifting it.  Pt. hadn't noticed a problem before that.  Pecs were also tight and couldn't figure out why.  Has a massage and that did help.  Says her breast swelling is fine.   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  HTN controlled with meds and diet; IBS; sleep apnea and uses CPAP.  Some back problems and sees a chriopractor.   Patient Stated Goals to not feel that little twinge I feel with moving the right arm   Currently in Pain? Yes   Pain Score 1    Pain Location Shoulder   Pain Orientation Right   Pain Descriptors / Indicators Tightness   Aggravating Factors  trying to reach arm down behind back   Pain Relieving Factors at rest            [facility] PT Assessment - 05/27/16 0001      Assessment   Medical Diagnosis right breast DCIS   Referring Provider Dr. Stark Klein   Onset Date/Surgical Date 09/18/13  approx.   Hand Dominance Right   Prior Therapy none  Precautions   Precautions Other (comment)   Precaution Comments cancer precautions     Restrictions   Weight Bearing Restrictions No     Balance Screen   Has the patient fallen in the past 6 months No   Has the patient had a decrease in activity level because of a fear of falling?  No   Is the patient reluctant to leave their home because of a fear of falling?  No     Home Ecologist residence   Living Arrangements Spouse/significant other   Type of [place] Two level     Prior Function   Level of Independence Independent   Vocation Part time employment   Corporate treasurer   Leisure 10,000 steps a day; does manual lymph drainage and some weights     Cognition    Overall Cognitive Status Within Functional Limits for tasks assessed     ROM / Strength   AROM / PROM / Strength AROM;Strength     AROM   AROM Assessment Site Shoulder   Right/Left Shoulder Right;Left   Right Shoulder Extension 63 Degrees   Right Shoulder Flexion 140 Degrees  feels a "twinge" at shoulder   Right Shoulder ABduction 170 Degrees  twinge   Right Shoulder Internal Rotation 63 Degrees  supine   Right Shoulder External Rotation 80 Degrees   Right Shoulder Horizontal ABduction 20 Degrees  twinge   Left Shoulder Extension 61 Degrees   Left Shoulder Flexion 172 Degrees   Left Shoulder ABduction 186 Degrees   Left Shoulder Internal Rotation 84 Degrees  supine   Left Shoulder External Rotation 90 Degrees   Left Shoulder Horizontal ABduction 27 Degrees     Strength   Overall Strength Comments both shoulders grossly 5/5     Ambulation/Gait   Ambulation/Gait Yes   Ambulation/Gait Assistance 7: Independent                   [facility] Adult PT Treatment/Exercise - 05/27/16 0001      Exercises   Exercises Shoulder     Shoulder Exercises: Standing   Flexion AAROM;Right  3x with dowel   ABduction AAROM;Right  3x with dowel   Extension AAROM;Both  with dowel 5x   Other Standing Exercises doorway stretch                        [facility] Goals - 05/27/16 2044      CC Long Term Goal  #1   Title Patient will be independent in HEP for right shoulder ROM.   Time 2   Period Weeks   Status New     CC Long Term Goal  #2   Title Pt. will report at least 50% decrease in right shoulder area pain when she reaches back or up.   Time 2   Period Weeks   Status New     CC Long Term Goal  #3   Title Pt. will show at least 160 degrees of right shoulder active flexion for improved overhead reach.   Time 2   Period Weeks   Status New     CC Long Term Goal  #4   Title Pt. will show at least 85 degrees of right shoulder external rotation for  improved ADLs.   Time 2   Period Weeks   Status New  Plan - 05/27/16 2038-05-06    Clinical Impression Statement This patient, who has been treated in this clinic before, returns now with reports of discomfort and limited ROM in right shoulder area.  She has a h/o right breast lymphedema, but reports this is essentially resolved.  She does have limited right compared to left shoulder ROM, and discomfort when she does move that arm. Eval is of low complexity today.   Rehab Potential Excellent   Clinical Impairments Affecting Rehab Potential none   PT Frequency 2x / week   PT Duration 2 weeks  to 4 weeks as needed   PT Treatment/Interventions ADLs/Self Care Home Management;Therapeutic exercise;Patient/family education;Manual techniques;Manual lymph drainage;Scar mobilization;Passive range of motion   PT Next Visit Plan Begin P/AA/A/ROM for right shoulder and HEP instruction for same; include myofascial release and soft tissue mobilization as appropriate.   Consulted and Agree with Plan of Care Patient      Patient will benefit from skilled therapeutic intervention in order to improve the following deficits and impairments:  Decreased range of motion, Impaired UE functional use, Pain  Visit Diagnosis: Stiffness of right shoulder, not elsewhere classified - Plan: PT plan of care cert/re-cert  Right shoulder pain, unspecified chronicity - Plan: PT plan of care cert/re-cert      G-Codes - 45/99/77 05-May-2045    Functional Assessment Tool Used (Outpatient Only) clinical judgement   Functional Limitation Carrying, moving and handling objects   Carrying, Moving and Handling Objects Current Status (S1423) At least 1 percent but less than 20 percent impaired, limited or restricted   Carrying, Moving and Handling Objects Goal Status (T5320) At least 1 percent but less than 20 percent impaired, limited or restricted       Problem List Patient Active Problem List   Diagnosis Date Noted   . Insect bite of arm, left 07/23/2014  . Allergic reaction 07/23/2014  . History of colonic polyps 05/30/2014  . Breast cancer of lower-outer quadrant of right female breast ([place]) 09/08/2013  . Unspecified menopausal and postmenopausal disorder 08/03/2012  . Lichen planus 23/34/3568  . UNSPECIFIED VITAMIN D DEFICIENCY 04/23/2010  . HYPERLIPIDEMIA 11/02/2007  . UNSPECIFIED ANEMIA 11/02/2007  . SICKLE CELL TRAIT 03/30/2007  . HYPERTENSION 03/30/2007  . GERD 03/30/2007  . IBS 03/30/2007  . SLEEP APNEA 03/30/2007  . ABNORMAL ELECTROCARDIOGRAM 03/30/2007  . History of positive PPD, untreated 03/30/2007    SALISBURY,DONNA 05/27/2016, 8:50 PM  [place], [place], [address] Phone: [phone]   Fax:  [phone]  Name: Cynthia Wagner MRN: [id number] Date of Birth: Jul 22, 1950  Serafina Royals, PT 05/27/16 8:50 PM

## 2016-05-27 NOTE — Patient Instructions (Signed)

## 2016-05-29 ENCOUNTER — Ambulatory Visit: Payer: Medicare Other

## 2016-05-29 DIAGNOSIS — M25611 Stiffness of right shoulder, not elsewhere classified: Secondary | ICD-10-CM | POA: Diagnosis not present

## 2016-05-29 DIAGNOSIS — M25511 Pain in right shoulder: Secondary | ICD-10-CM

## 2016-05-29 NOTE — Patient Instructions (Addendum)
CHEST: Doorway, Bilateral - Standing    Standing in doorway with one foot in front of other, place hands on wall with elbows bent at shoulder height. Lean forward. Hold _10-20__ seconds. _3-5__ reps per set, _2-3__ sets per day.  Copyright  VHI. All rights reserved.   Can roll large ball up wall:  1. Facing wall use both hands, roll ball up wall and lean forward into end of stretch  2. Then with Rt side facing wall, have Rt forearm on ball and straighten elbow to feel stretch, then lean onto Rt foot into end of stretch 10 times each, holding 3-5 seconds  ROM: Towel Stretch - with Interior Rotation    Pull left arm up behind back by pulling towel up with other arm. Hold __5-10__ seconds. Repeat _5___ times per set. Do __1-2__ sessions per day.  http://orth.exer.us/889   Copyright  VHI. All rights reserved.   If you get pulleys, do 2-4 minutes raising arm straight in front keeping shoulder down, then do same out to side.

## 2016-05-29 NOTE — Therapy (Signed)
[place], [place], [address] Phone: [phone]   Fax:  [phone]  Physical Therapy Treatment  Patient Details  Name: Cynthia Wagner MRN: [id number] Date of Birth: Oct 21, 1950 Referring Provider: Dr. Stark Klein  Encounter Date: 05/29/2016      PT End of Session - 05/29/16 1033    Visit Number 2   Number of Visits 9   Date for PT Re-Evaluation 07/04/16   PT Start Time 0939   PT Stop Time 1027   PT Time Calculation (min) 48 min   Activity Tolerance Patient tolerated treatment well   Behavior During Therapy [facility] for tasks assessed/performed      Past Medical History:  Diagnosis Date  . Breast cancer 09/2013   right  . Complication of anesthesia    states is sensitive to narcotics  . Dental bridge present    left upper; right upper and lower  . History of radiation therapy 10/27/13- 11/29/13   right breast 4800 cGy 24 sessions  . Hypertension    under control with med., has been on med. since 2006  . IBS (irritable bowel syndrome)   . PONV (postoperative nausea and vomiting)   . Scoliosis   . Sleep apnea    uses CPAP most nights    Past Surgical History:  Procedure Laterality Date  . BREAST LUMPECTOMY WITH NEEDLE LOCALIZATION AND AXILLARY SENTINEL LYMPH NODE BX Right 09/14/2013   Procedure: BREAST LUMPECTOMY WITH NEEDLE LOCALIZATION AND AXILLARY SENTINEL LYMPH NODE BX;  Surgeon: Stark Klein, MD;  Location: [facility];  Service: General;  Laterality: Right;  . CARPAL TUNNEL RELEASE Bilateral 1991  . CESAREAN SECTION     x 2  . COLONOSCOPY  2003; 2006  . colonoscopy with polypectomy  12/2013   Dr Collene Mares; due 2021  . ESOPHAGOGASTRODUODENOSCOPY  10/11/2003  . HAMMER TOE SURGERY Left 06/25/2011  . HEMORRHOID SURGERY    . VAGINAL HYSTERECTOMY  1995   ovaries intact, fibroids  . VULVA /PERINEUM BIOPSY      There were no vitals filed for this visit.      Subjective  Assessment - 05/29/16 0944    Subjective I can tell already that my shoulder is getting better. I think it's just a combination of a few things, like having a massage recently and then starting the exercises she gave me Monday.   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  HTN controlled with meds and diet; IBS; sleep apnea and uses CPAP.  Some back problems and sees a chriopractor.   Patient Stated Goals to not feel that little twinge I feel with moving the right arm   Currently in Pain? No/denies                         [facility] Adult PT Treatment/Exercise - 05/29/16 0001      Shoulder Exercises: Pulleys   Flexion 2 minutes   Flexion Limitations VC to decrease Rt scapular compensation   ABduction 2 minutes     Shoulder Exercises: Therapy Ball   Flexion 10 reps  With forward lean into end of stretch   ABduction 5 reps  Rt UE with side lean into end of stretch     Shoulder Exercises: Stretch   Corner Stretch 5 reps;10 seconds  In doorway     Manual Therapy   Manual Therapy Passive ROM;Myofascial  release   Myofascial Release To Rt chest wall during P/ROM and UE pulling throughout P/ROM   Passive ROM In Supine to Rt shoulder into flexion, abduction and er to pts tolerance                PT Education - 05/29/16 1004    Education provided Yes   Education Details AA/ROM exercises   Person(s) Educated Patient   Methods Explanation;Demonstration;Handout   Comprehension Verbalized understanding;Returned demonstration;Need further instruction                [facility] Goals - 05/27/16 2044      CC Long Term Goal  #1   Title Patient will be independent in HEP for right shoulder ROM.   Time 2   Period Weeks   Status New     CC Long Term Goal  #2   Title Pt. will report at least 50% decrease in right shoulder area pain when she reaches back or up.   Time 2    Period Weeks   Status New     CC Long Term Goal  #3   Title Pt. will show at least 160 degrees of right shoulder active flexion for improved overhead reach.   Time 2   Period Weeks   Status New     CC Long Term Goal  #4   Title Pt. will show at least 85 degrees of right shoulder external rotation for improved ADLs.   Time 2   Period Weeks   Status New            Plan - 05/29/16 1034    Clinical Impression Statement Pt tolerated first session of stretching well without any c/o increase pain during or at end of session. She reported feeling much better and looser. She reports her motion is already improving from exercises issued ot her at initial session and doesn't feel she need to come for too many more visits. for now plan on 2 visits over next 2 weeks to cont to assess ROM and progress HEP prn. She does still demonstrate tightness at her end P/ROM and reports weakness here which is why a few more visits will be beneficial, to address these deficits.    Rehab Potential Excellent   Clinical Impairments Affecting Rehab Potential none   PT Frequency 2x / week   PT Duration 2 weeks  to 4 weeks prn   PT Treatment/Interventions ADLs/Self Care Home Management;Therapeutic exercise;Patient/family education;Manual techniques;Manual lymph drainage;Scar mobilization;Passive range of motion   PT Next Visit Plan Cont P/AA/A/ROM for right shoulder and HEP instruction for same including myofascial release and soft tissue mobilization as appropriate. Add supine scapular series for improved posture decreasing chest tightness.   PT Home Exercise Plan AA/ROM exercises, see education section   Consulted and Agree with Plan of Care Patient      Patient will benefit from skilled therapeutic intervention in order to improve the following deficits and impairments:  Decreased range of motion, Impaired UE functional use, Pain  Visit Diagnosis: Stiffness of right shoulder, not elsewhere  classified  Right shoulder pain, unspecified chronicity     Problem List Patient Active Problem List   Diagnosis Date Noted  . Insect bite of arm, left 07/23/2014  . Allergic reaction 07/23/2014  . History of colonic polyps 05/30/2014  . Breast cancer of lower-outer quadrant of right female breast ([place]) 09/08/2013  . Unspecified menopausal and postmenopausal disorder 08/03/2012  . Lichen planus 23/53/6144  .  UNSPECIFIED VITAMIN D DEFICIENCY 04/23/2010  . HYPERLIPIDEMIA 11/02/2007  . UNSPECIFIED ANEMIA 11/02/2007  . SICKLE CELL TRAIT 03/30/2007  . HYPERTENSION 03/30/2007  . GERD 03/30/2007  . IBS 03/30/2007  . SLEEP APNEA 03/30/2007  . ABNORMAL ELECTROCARDIOGRAM 03/30/2007  . History of positive PPD, untreated 03/30/2007    Otelia Limes, PTA 05/29/2016, 10:40 AM  [place], [place], [address] Phone: [phone]   Fax:  [phone]  Name: MALAYNA NOORI MRN: [id number] Date of Birth: September 03, 1950

## 2016-05-31 ENCOUNTER — Ambulatory Visit: Payer: BLUE CROSS/BLUE SHIELD | Admitting: Physical Therapy

## 2016-06-03 ENCOUNTER — Ambulatory Visit: Payer: Medicare Other | Admitting: Physical Therapy

## 2016-06-03 DIAGNOSIS — M25611 Stiffness of right shoulder, not elsewhere classified: Secondary | ICD-10-CM

## 2016-06-03 DIAGNOSIS — M25511 Pain in right shoulder: Secondary | ICD-10-CM

## 2016-06-03 NOTE — Patient Instructions (Signed)
Do this once a day:  Roll up a bath towel or two.  Lie down with the towel directly under your spine, a pillow under your head.  Do this toward the right edge of the bed.  1) Relax your shoulders back, letting your shoulder blades melt down around the sides of the towel.  2) Raise both arms up toward the ceiling, elbows straight, then take them out to the sides until you feel a stretch at your chest.  Hold for 30 seconds x 2.  3) With arms at your sides, palms up, press arms into the bed like you're trying to make an impression in the sand.  Hold 3 seconds and do 5-10 repetitions.   Over Head Pull: Narrow and Wide Grip   Cancer Rehab [phone]   On back, knees bent, feet flat, band across thighs, elbows straight but relaxed. Pull hands apart (start). Keeping elbows straight, bring arms up and over head, hands toward floor. Keep pull steady on band. Hold momentarily. Return slowly, keeping pull steady, back to start. Then do same with a wider grip on the band (past shoulder width) Repeat _5-10__ times. Band color __yellow____   Side Pull: Double Arm   On back, knees bent, feet flat. Arms perpendicular to body, shoulder level, elbows straight but relaxed. Pull arms out to sides, elbows straight. Resistance band comes across collarbones, hands toward floor. Hold momentarily. Slowly return to starting position. Repeat _5-10__ times. Band color _yellow____   Sword   On back, knees bent, feet flat, left hand on left hip, right hand above left. Pull right arm DIAGONALLY (hip to shoulder) across chest. Bring right arm along head toward floor. Hold momentarily. Slowly return to starting position. Repeat _5-10__ times. Do with left arm. Band color _yellow_____   Shoulder Rotation: Double Arm   On back, knees bent, feet flat, elbows tucked at sides, bent 90, hands palms up. Pull hands apart and down toward floor, keeping elbows near sides. Hold momentarily. Slowly return to starting  position. Repeat _5-10__ times. Band color __yellow____

## 2016-06-03 NOTE — Therapy (Signed)
[facility] [address] [place], [place], [address] Phone: [phone]   Fax:  [phone]  Physical Therapy Treatment  Patient Details  Name: Cynthia Wagner MRN: [id number] Date of Birth: 08/30/1950 Referring Provider: Dr. Faera Byerly  Encounter Date: 06/03/2016      PT End of Session - 06/03/16 0852    Visit Number 3   Number of Visits 9   Date for PT Re-Evaluation 07/04/16   PT Start Time 0800   PT Stop Time 0845   PT Time Calculation (min) 45 min   Activity Tolerance Patient tolerated treatment well   Behavior During Therapy [facility] for tasks assessed/performed      Past Medical History:  Diagnosis Date  . Breast cancer 09/2013   right  . Complication of anesthesia    states is sensitive to narcotics  . Dental bridge present    left upper; right upper and lower  . History of radiation therapy 10/27/13- 11/29/13   right breast 4800 cGy 24 sessions  . Hypertension    under control with med., has been on med. since 2006  . IBS (irritable bowel syndrome)   . PONV (postoperative nausea and vomiting)   . Scoliosis   . Sleep apnea    uses CPAP most nights    Past Surgical History:  Procedure Laterality Date  . BREAST LUMPECTOMY WITH NEEDLE LOCALIZATION AND AXILLARY SENTINEL LYMPH NODE BX Right 09/14/2013   Procedure: BREAST LUMPECTOMY WITH NEEDLE LOCALIZATION AND AXILLARY SENTINEL LYMPH NODE BX;  Surgeon: Faera Byerly, MD;  Location: [facility];  Service: General;  Laterality: Right;  . CARPAL TUNNEL RELEASE Bilateral 1991  . CESAREAN SECTION     x 2  . COLONOSCOPY  2003; 2006  . colonoscopy with polypectomy  12/2013   Dr Mann; due 2021  . ESOPHAGOGASTRODUODENOSCOPY  10/11/2003  . HAMMER TOE SURGERY Left 06/25/2011  . HEMORRHOID SURGERY    . VAGINAL HYSTERECTOMY  1995   ovaries intact, fibroids  . VULVA /PERINEUM BIOPSY      There were no vitals filed for this visit.      Subjective  Assessment - 06/03/16 0803    Subjective "It's gotten a lot better."   Currently in Pain? No/denies            [facility] PT Assessment - 06/03/16 0001      Assessment   Medical Diagnosis       AROM   Right Shoulder Flexion 155 Degrees   Right Shoulder ABduction 173 Degrees                     [facility] Adult PT Treatment/Exercise - 06/03/16 0001      Shoulder Exercises: Supine   Horizontal ABduction AROM;Both  over foam roll, hold for 60+ seconds   Horizontal ABduction Limitations Also horizontal abduction and bilat. D2 actively x 10+ each.   Other Supine Exercises supine over foam roll, scapular retraction   Other Supine Exercises supine scapular series with yellow Theraband as per pt. instruction section, 5 times each     Shoulder Exercises: Isometric Strengthening   Extension Supine  over foam roll, 3" x 5; then flat supine, 3" x 5     Manual Therapy   Myofascial Release right UE pulling with movement into abduction   Passive ROM In Supine to Rt shoulder into flexion, abduction and er to pts tolerance                  PT Education - 06/03/16 0851    Education provided Yes   Education Details stretches for horizontal abduction and strengthening for shoulder extension supine over foam roll; supine scapular series vs. yellow Theraband   Person(s) Educated Patient   Methods Explanation;Demonstration;Tactile cues;Verbal cues;Handout   Comprehension Verbalized understanding;Returned demonstration                [facility] Goals - 06/03/16 0855      CC Long Term Goal  #1   Title Patient will be independent in HEP for right shoulder ROM.   Status Partially Met     CC Long Term Goal  #2   Title Pt. will report at least 50% decrease in right shoulder area pain when she reaches back or up.   Status On-going     CC Long Term Goal  #3   Title Pt. will show at least 160 degrees of right shoulder active flexion for improved overhead reach.    Baseline 155 as of 06/03/16   Status Partially Met     CC Long Term Goal  #4   Title Pt. will show at least 85 degrees of right shoulder external rotation for improved ADLs.   Status On-going            Plan - 06/03/16 0852    Clinical Impression Statement Pt. did well again today.  She still reports "twinges" with movement of right arm, such as doing D2 motion vs. yellow Theraband.  She has increased AROM in right shoulder flexion by 15 degrees and abduction by 3 degrees.  She continues to request home exercise program instruction.   Rehab Potential Excellent   Clinical Impairments Affecting Rehab Potential none   PT Frequency 2x / week   PT Duration 2 weeks  to 4 weeks prn   PT Treatment/Interventions ADLs/Self Care Home Management;Therapeutic exercise;Patient/family education;Manual techniques;Manual lymph drainage;Scar mobilization;Passive range of motion   PT Next Visit Plan Cont P/AA/A/ROM for right shoulder and HEP instruction for same including myofascial release and soft tissue mobilization as appropriate. Review supine scapular series with yellow Theraband and issue red for future use.   PT Home Exercise Plan AA/ROM exercises, scapular strengthening, stretches for chest and shoulder horizontal abduction   Consulted and Agree with Plan of Care Patient      Patient will benefit from skilled therapeutic intervention in order to improve the following deficits and impairments:  Decreased range of motion, Impaired UE functional use, Pain  Visit Diagnosis: Stiffness of right shoulder, not elsewhere classified  Right shoulder pain, unspecified chronicity     Problem List Patient Active Problem List   Diagnosis Date Noted  . Insect bite of arm, left 07/23/2014  . Allergic reaction 07/23/2014  . History of colonic polyps 05/30/2014  . Breast cancer of lower-outer quadrant of right female breast ([facility]) 09/08/2013  . Unspecified menopausal and postmenopausal disorder  08/03/2012  . Lichen planus 05/04/2012  . UNSPECIFIED VITAMIN D DEFICIENCY 04/23/2010  . HYPERLIPIDEMIA 11/02/2007  . UNSPECIFIED ANEMIA 11/02/2007  . SICKLE CELL TRAIT 03/30/2007  . HYPERTENSION 03/30/2007  . GERD 03/30/2007  . IBS 03/30/2007  . SLEEP APNEA 03/30/2007  . ABNORMAL ELECTROCARDIOGRAM 03/30/2007  . History of positive PPD, untreated 03/30/2007    SALISBURY,DONNA 06/03/2016, 8:56 AM  [facility] [address] [place], [place], [address] Phone: [phone]   Fax:  [phone]  Name: Cynthia Wagner MRN: [id number] Date of Birth: 02/27/1950  Donna Salisbury, PT 06/03/16 8:56   AM

## 2016-06-04 ENCOUNTER — Ambulatory Visit: Payer: Medicare Other | Attending: General Surgery

## 2016-06-04 DIAGNOSIS — M25611 Stiffness of right shoulder, not elsewhere classified: Secondary | ICD-10-CM | POA: Diagnosis not present

## 2016-06-04 DIAGNOSIS — M25511 Pain in right shoulder: Secondary | ICD-10-CM

## 2016-06-04 NOTE — Therapy (Signed)
[place], [place], [address] Phone: [phone]   Fax:  [phone]  Physical Therapy Treatment  Patient Details  Name: Cynthia Wagner MRN: [id number] Date of Birth: October 27, 1950 Referring Provider: Dr. Stark Klein  Encounter Date: 06/04/2016      PT End of Session - 06/04/16 0849    Visit Number 4   Number of Visits 9   Date for PT Re-Evaluation 07/04/16   PT Start Time 0802   PT Stop Time 0848   PT Time Calculation (min) 46 min   Activity Tolerance Patient tolerated treatment well   Behavior During Therapy [facility] for tasks assessed/performed      Past Medical History:  Diagnosis Date  . Breast cancer 09/2013   right  . Complication of anesthesia    states is sensitive to narcotics  . Dental bridge present    left upper; right upper and lower  . History of radiation therapy 10/27/13- 11/29/13   right breast 4800 cGy 24 sessions  . Hypertension    under control with med., has been on med. since 2006  . IBS (irritable bowel syndrome)   . PONV (postoperative nausea and vomiting)   . Scoliosis   . Sleep apnea    uses CPAP most nights    Past Surgical History:  Procedure Laterality Date  . BREAST LUMPECTOMY WITH NEEDLE LOCALIZATION AND AXILLARY SENTINEL LYMPH NODE BX Right 09/14/2013   Procedure: BREAST LUMPECTOMY WITH NEEDLE LOCALIZATION AND AXILLARY SENTINEL LYMPH NODE BX;  Surgeon: Stark Klein, MD;  Location: [facility];  Service: General;  Laterality: Right;  . CARPAL TUNNEL RELEASE Bilateral 1991  . CESAREAN SECTION     x 2  . COLONOSCOPY  2003; 2006  . colonoscopy with polypectomy  12/2013   Dr Collene Mares; due 2021  . ESOPHAGOGASTRODUODENOSCOPY  10/11/2003  . HAMMER TOE SURGERY Left 06/25/2011  . HEMORRHOID SURGERY    . VAGINAL HYSTERECTOMY  1995   ovaries intact, fibroids  . VULVA /PERINEUM BIOPSY      There were no vitals filed for this visit.      Subjective  Assessment - 06/04/16 0804    Subjective I felt fine after my visit yesterday, no increased pain.    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  HTN controlled with meds and diet; IBS; sleep apnea and uses CPAP.  Some back problems and sees a chriopractor.   Patient Stated Goals to not feel that little twinge I feel with moving the right arm   Currently in Pain? No/denies                         [facility] Adult PT Treatment/Exercise - 06/04/16 0001      Shoulder Exercises: Supine   Horizontal ABduction AROM;Both  over foam roll, hold for 2 mins   Horizontal ABduction Limitations Also horizontal abduction and bilat. D2 actively x 10 each.   Other Supine Exercises supine over foam roll, scapular retraction 10 times   Other Supine Exercises supine scapular series with red Theraband as per pt. instruction section, 10 times each issuing red today for pt to use when she feels ready at home in place of yellow.      Manual Therapy   Manual Therapy Myofascial release;Passive ROM   Myofascial Release right UE pulling with movement into abduction   Passive ROM In Supine  to Rt shoulder into flexion, abduction and er to pts tolerance                PT Education - 06/03/16 0851    Education provided Yes   Education Details stretches for horizontal abduction and strengthening for shoulder extension supine over foam roll; supine scapular series vs. yellow Theraband   Person(s) Educated Patient   Methods Explanation;Demonstration;Tactile cues;Verbal cues;Handout   Comprehension Verbalized understanding;Returned demonstration                [facility] Goals - 06/03/16 0855      CC Long Term Goal  #1   Title Patient will be independent in HEP for right shoulder ROM.   Status Partially Met     CC Long Term Goal  #2   Title Pt. will report at least 50% decrease in right shoulder  area pain when she reaches back or up.   Status On-going     CC Long Term Goal  #3   Title Pt. will show at least 160 degrees of right shoulder active flexion for improved overhead reach.   Baseline 155 as of 06/03/16   Status Partially Met     CC Long Term Goal  #4   Title Pt. will show at least 85 degrees of right shoulder external rotation for improved ADLs.   Status On-going            Plan - 06/04/16 0849    Clinical Impression Statement PT continues to tolerate therapy well. Progressed her supine scapular series to red theraband which she tolerated very well reporting the increased resistance felt good. Pt also reported feeling good after being stretched at end of session. Pt also really liked stretching on foam roller so gave her information on how to order one if she wanted at her request.    Rehab Potential Excellent   Clinical Impairments Affecting Rehab Potential none   PT Frequency 2x / week   PT Duration 2 weeks  to 4 weeks prn   PT Treatment/Interventions ADLs/Self Care Home Management;Therapeutic exercise;Patient/family education;Manual techniques;Manual lymph drainage;Scar mobilization;Passive range of motion   PT Next Visit Plan Cont P/AA/A/ROM for right shoulder and HEP instruction for same including myofascial release and soft tissue mobilization as appropriate. Possibly progress pt to standing 3 way raises.   PT Home Exercise Plan AA/ROM exercises, scapular strengthening, stretches for chest and shoulder horizontal abduction   Consulted and Agree with Plan of Care Patient      Patient will benefit from skilled therapeutic intervention in order to improve the following deficits and impairments:  Decreased range of motion, Impaired UE functional use, Pain  Visit Diagnosis: Stiffness of right shoulder, not elsewhere classified  Right shoulder pain, unspecified chronicity     Problem List Patient Active Problem List   Diagnosis Date Noted  . Insect bite of  arm, left 07/23/2014  . Allergic reaction 07/23/2014  . History of colonic polyps 05/30/2014  . Breast cancer of lower-outer quadrant of right female breast ([place]) 09/08/2013  . Unspecified menopausal and postmenopausal disorder 08/03/2012  . Lichen planus 09/81/1914  . UNSPECIFIED VITAMIN D DEFICIENCY 04/23/2010  . HYPERLIPIDEMIA 11/02/2007  . UNSPECIFIED ANEMIA 11/02/2007  . SICKLE CELL TRAIT 03/30/2007  . HYPERTENSION 03/30/2007  . GERD 03/30/2007  . IBS 03/30/2007  . SLEEP APNEA 03/30/2007  . ABNORMAL ELECTROCARDIOGRAM 03/30/2007  . History of positive PPD, untreated 03/30/2007    Otelia Limes, PTA 06/04/2016, 8:52 AM  [facility]  [place], [place], [address] Phone: [phone]   Fax:  [phone]  Name: Cynthia Wagner MRN: [id number] Date of Birth: 1950/08/05

## 2016-06-10 ENCOUNTER — Ambulatory Visit: Payer: Medicare Other | Admitting: Physical Therapy

## 2016-06-10 DIAGNOSIS — M25511 Pain in right shoulder: Secondary | ICD-10-CM

## 2016-06-10 DIAGNOSIS — M25611 Stiffness of right shoulder, not elsewhere classified: Secondary | ICD-10-CM | POA: Diagnosis not present

## 2016-06-10 NOTE — Therapy (Signed)
[place], [place], [address] Phone: [phone]   Fax:  [phone]  Physical Therapy Treatment  Patient Details  Name: Cynthia Wagner MRN: [id number] Date of Birth: [birth year] Referring Provider: Dr. Stark Klein  Encounter Date: 06/10/2016      PT End of Session - 06/10/16 1731    Visit Number 5   Number of Visits 9   Date for PT Re-Evaluation 07/04/16   PT Start Time 2197   PT Stop Time 1429   PT Time Calculation (min) 50 min   Activity Tolerance Patient tolerated treatment well   Behavior During Therapy [facility] for tasks assessed/performed      Past Medical History:  Diagnosis Date  . Breast cancer 09/2013   right  . Complication of anesthesia    states is sensitive to narcotics  . Dental bridge present    left upper; right upper and lower  . History of radiation therapy 10/27/13- 11/29/13   right breast 4800 cGy 24 sessions  . Hypertension    under control with med., has been on med. since 2006  . IBS (irritable bowel syndrome)   . PONV (postoperative nausea and vomiting)   . Scoliosis   . Sleep apnea    uses CPAP most nights    Past Surgical History:  Procedure Laterality Date  . BREAST LUMPECTOMY WITH NEEDLE LOCALIZATION AND AXILLARY SENTINEL LYMPH NODE BX Right 09/14/2013   Procedure: BREAST LUMPECTOMY WITH NEEDLE LOCALIZATION AND AXILLARY SENTINEL LYMPH NODE BX;  Surgeon: Stark Klein, MD;  Location: [place];  Service: General;  Laterality: Right;  . CARPAL TUNNEL RELEASE Bilateral 1991  . CESAREAN SECTION     x 2  . COLONOSCOPY  2003; 2006  . colonoscopy with polypectomy  12/2013   Dr Collene Mares; due 2021  . ESOPHAGOGASTRODUODENOSCOPY  10/11/2003  . HAMMER TOE SURGERY Left 06/25/2011  . HEMORRHOID SURGERY    . VAGINAL HYSTERECTOMY  1995   ovaries intact, fibroids  . VULVA /PERINEUM BIOPSY      There were no vitals filed for this visit.      Subjective  Assessment - 06/10/16 1339    Subjective Nothing new.  Working on exercises at home. Likes the stretch over towel roll and the doorway stretch; also the Theraband exercise.  Hasn't found the jumprope to put over the door to make pulleys yet.   Currently in Pain? No/denies            [facility] PT Assessment - 06/10/16 0001      AROM   Right Shoulder Flexion 160 Degrees   Right Shoulder ABduction 175 Degrees   Right Shoulder Internal Rotation 76 Degrees   Right Shoulder External Rotation 94 Degrees                     [facility] Adult PT Treatment/Exercise - 06/10/16 0001      Shoulder Exercises: Supine   Other Supine Exercises supine over foam roller, "melt" shoulder blades down around sides of foam roller; then isometric shoulder extension with scapular retraction 3 counts x 10   Other Supine Exercises supine over foam roller, bilat. active D2 x 10     Shoulder Exercises: Standing   Internal Rotation AAROM;Both;10 reps  with dowel in two positions    Extension AAROM;Both;10 reps  with dowel     Manual Therapy   Manual Therapy Scapular mobilization   Myofascial Release right UE pulling with movement into  abduction   Scapular Mobilization to right side in left sidelying with movement into protraction and depression   Passive ROM Supine over foam roller, pect minor stretches x 2 with 60 second holds; stretch for right shoulder er, flexion, abduction.                PT Education - 06/10/16 1730    Education provided Yes   Education Details shoulder extension and internal rotation stretches with dowel   Person(s) Educated Patient   Methods Explanation;Verbal cues;Handout   Comprehension Verbalized understanding;Returned demonstration                [facility] Goals - 06/10/16 1341      CC Long Term Goal  #1   Title Patient will be independent in HEP for right shoulder ROM.   Status Achieved     CC Long Term Goal  #2   Title Pt. will report at  least 50% decrease in right shoulder area pain when she reaches back or up.   Baseline 50% better with reaching back, about 100% better with reaching up as of 06/10/16   Status Achieved     CC Long Term Goal  #3   Title Pt. will show at least 160 degrees of right shoulder active flexion for improved overhead reach.   Status Achieved     CC Long Term Goal  #4   Title Pt. will show at least 85 degrees of right shoulder external rotation for improved ADLs.   Status Achieved            Plan - 06/10/16 1731    Clinical Impression Statement Pt. is pleased with her progress to date. Her right shoulder ROM is significantly improved.  She still benefits from stretching. She will be out of town for a while and was not sure about her next appointment date, so needed to sort out the times she will be out of town, but plans to come back.  Three of four goals have now been met; patient has just made 50% improvement goal today.   Rehab Potential Excellent   Clinical Impairments Affecting Rehab Potential none   PT Frequency 2x / week   PT Duration 2 weeks  to 4 weeks prn   PT Treatment/Interventions ADLs/Self Care Home Management;Therapeutic exercise;Patient/family education;Manual techniques;Manual lymph drainage;Scar mobilization;Passive range of motion   PT Next Visit Plan Cont P/AA/A/ROM for right shoulder and HEP instruction for same including myofascial release and soft tissue mobilization as appropriate. Possibly progress pt to standing 3 way raises.   PT Home Exercise Plan AA/ROM exercises, scapular strengthening, stretches for chest and shoulder horizontal abduction; dowel stretches for extension and ir   Consulted and Agree with Plan of Care Patient      Patient will benefit from skilled therapeutic intervention in order to improve the following deficits and impairments:  Decreased range of motion, Impaired UE functional use, Pain  Visit Diagnosis: Stiffness of right shoulder, not  elsewhere classified  Right shoulder pain, unspecified chronicity     Problem List Patient Active Problem List   Diagnosis Date Noted  . Insect bite of arm, left 07/23/2014  . Allergic reaction 07/23/2014  . History of colonic polyps 05/30/2014  . Breast cancer of lower-outer quadrant of right female breast ([place]) 09/08/2013  . Unspecified menopausal and postmenopausal disorder 08/03/2012  . Lichen planus 23/76/2831  . UNSPECIFIED VITAMIN D DEFICIENCY 04/23/2010  . HYPERLIPIDEMIA 11/02/2007  . UNSPECIFIED ANEMIA 11/02/2007  . SICKLE  CELL TRAIT 03/30/2007  . HYPERTENSION 03/30/2007  . GERD 03/30/2007  . IBS 03/30/2007  . SLEEP APNEA 03/30/2007  . ABNORMAL ELECTROCARDIOGRAM 03/30/2007  . History of positive PPD, untreated 03/30/2007    Micaiah Remillard 06/10/2016, 5:35 PM  [place], [place], [address] Phone: [phone]   Fax:  [phone]  Name: Cynthia Wagner MRN: [id number] Date of Birth: 1950-04-01  Serafina Royals, PT 06/10/16 5:36 PM

## 2016-06-10 NOTE — Patient Instructions (Signed)
ROM: Extension - Wand (Standing)    Stand holding wand behind back. Raise arms as far as possible.  Hold for 10 seconds. Repeat _3-6___ times per set. Do _1___ sets per session. Do __1-2Shoulder Internal Rotation    Standing, feet shoulder width apart, grasp club with one hand palm forward, arm extended above head, and other hand palm back behind back, arm bent elbow down. Pull gently upward. Hold __10__ seconds. Switch arms and repeat. Repeat ___3-6_ times. Do __1-2__ sessions per day.  Copyright  VHI. All rights reserved.  __ sessions per day.  http://orth.exer.us/931   Copyright  VHI. All rights reserved.    Cane Exercise: Extension / Internal Rotation    Stand holding cane behind back with both hands palm-up. Slide cane up spine toward head. Hold __10__ seconds. Repeat __3-6__ times. Do _1-2___ sessions per day.  http://gt2.exer.us/86   Copyright  VHI. All rights reserved.

## 2016-06-24 ENCOUNTER — Ambulatory Visit: Payer: Medicare Other

## 2016-07-03 ENCOUNTER — Ambulatory Visit: Payer: Medicare Other | Admitting: Physical Therapy

## 2016-07-03 DIAGNOSIS — M25611 Stiffness of right shoulder, not elsewhere classified: Secondary | ICD-10-CM

## 2016-07-03 DIAGNOSIS — M25511 Pain in right shoulder: Secondary | ICD-10-CM

## 2016-07-03 NOTE — Patient Instructions (Addendum)
First of all, check with your insurance company to see if provider is in [facility]                                            [address]  [place], [place] [address] [phone]    Does not file for insurance--- call for appointment with [facility]   (for wigs and compression sleeves / gloves/gauntlets )  [place], [place] [address] [phone]  Will file some insurances --- call for appointment   Second to [facility] (for mastectomy prosthetics and garments) [place], [place] [address] [phone] Will file some insurances --- call for appointment  [facility]  [address]  [place], [place] [address] [phone] Lower extremity garments  [facility] [address] [place], [place]  [address] [place] Sales rep:  [place]:  [phone] [email or web address] Biocompression pumps   [employer]  Sales rep: Donneta Romberg:  [phone] [email or web address] [place] Pulley and Flexitouch pumps    Other Resources: National Lymphedema Network:  www.lymphnet.org www.Klosetraining.com for patient articles and purchase a self manual lymph drainage DVD www.[email or web address] has informative articles.     Flexion (Eccentric) - Active-Assist (Cane)          Cancer Rehab [phone]    Use unaffected arm to push affected arm forward. Avoid hiking shoulder (shoulder should NOT touch cheek). Keep palm relaxed. Slowly lower affected arm. Hold stretch for _5_ seconds repeating _5-10_ times, _1-2_ times a day.  Abduction (Eccentric) - Active-Assist (Cane)    Use unaffected arm to push affected arm out to side. Avoid hiking shoulder (shoulder should NOT touch cheek). Keep palm relaxed. Slowly lower affected arm. Hold stretch _5_ seconds repeating _5-10_ times, _1-2_ times a day.  Cane Exercise: Extension   Stand holding cane behind back  with both hands palm-up. Lift the cane away from body until gentle stretch felt. Do NOT lean forward.  Hold __5__ seconds. Repeat _5-10___ times. Do _1-2___ sessions per day.  CHEST: Doorway, Bilateral - Standing    Standing in doorway, place hands on wall with elbows bent at shoulder height and place one foot in front of other. Shift weight onto front foot. Hold _10-20__ seconds. Do _3-5_ times, _1-2_ times a day.    Flexion (Isometric)      Cancer Rehab [phone]    Press right fist against wall. Hold __5__ seconds. Repeat _5-10___ times. Do __1-2__ sessions per day.  SHOULDER: Abduction (Isometric)    Use wall as resistance. Press arm against pillow. Hold _5__ seconds. _5-10__ times. Do _1-2__ sessions per day.    External Rotation (Isometric)    Place back of left fist against door frame, with elbow bent. Press fist against door frame. Hold __5__ seconds. Repeat _5-10___ times. Do _1-2___ sessions per day.  Extension (Isometric)    Place left bent elbow and back of arm against wall. Press elbow against wall. Hold __5__ seconds. Repeat _5-10___ times. Do _1-2___ sessions per day.

## 2016-07-03 NOTE — Therapy (Signed)
[place], [place], [address] Phone: [phone]   Fax:  [phone]  Physical Therapy Treatment  Patient Details  Name: Cynthia Wagner MRN: [id number] Date of Birth: 1950-10-28 Referring Provider: Dr. Stark Klein  Encounter Date: 07/03/2016      PT End of Session - 07/03/16 1211    Visit Number 6   Number of Visits 9   Date for PT Re-Evaluation 07/04/16   PT Start Time 1100   PT Stop Time 1145   PT Time Calculation (min) 45 min   Activity Tolerance Patient tolerated treatment well   Behavior During Therapy [facility] for tasks assessed/performed      Past Medical History:  Diagnosis Date  . Breast cancer 09/2013   right  . Complication of anesthesia    states is sensitive to narcotics  . Dental bridge present    left upper; right upper and lower  . History of radiation therapy 10/27/13- 11/29/13   right breast 4800 cGy 24 sessions  . Hypertension    under control with med., has been on med. since 2006  . IBS (irritable bowel syndrome)   . PONV (postoperative nausea and vomiting)   . Scoliosis   . Sleep apnea    uses CPAP most nights    Past Surgical History:  Procedure Laterality Date  . BREAST LUMPECTOMY WITH NEEDLE LOCALIZATION AND AXILLARY SENTINEL LYMPH NODE BX Right 09/14/2013   Procedure: BREAST LUMPECTOMY WITH NEEDLE LOCALIZATION AND AXILLARY SENTINEL LYMPH NODE BX;  Surgeon: Stark Klein, MD;  Location: [place];  Service: General;  Laterality: Right;  . CARPAL TUNNEL RELEASE Bilateral 1991  . CESAREAN SECTION     x 2  . COLONOSCOPY  2003; 2006  . colonoscopy with polypectomy  12/2013   Dr Collene Mares; due 2021  . ESOPHAGOGASTRODUODENOSCOPY  10/11/2003  . HAMMER TOE SURGERY Left 06/25/2011  . HEMORRHOID SURGERY    . VAGINAL HYSTERECTOMY  1995   ovaries intact, fibroids  . VULVA /PERINEUM BIOPSY      There were no vitals filed for this visit.      Subjective  Assessment - 07/03/16 1158    Subjective Pt states she was on vacation and did not do her exercises as she should have and she feels a little tightness. She is planning to go to the mountains next week and fly to [place] later on this summer.    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  HTN controlled with meds and diet; IBS; sleep apnea and uses CPAP.  Some back problems and sees a chriopractor.   Patient Stated Goals to not feel that little twinge I feel with moving the right arm   Currently in Pain? No/denies            [facility] PT Assessment - 07/03/16 0001      AROM   Right Shoulder Flexion 160 Degrees   Right Shoulder ABduction 175 Degrees   Right Shoulder Internal Rotation 60 Degrees   Right Shoulder External Rotation 90 Degrees           LYMPHEDEMA/ONCOLOGY QUESTIONNAIRE - 07/03/16 1114      Right Upper Extremity Lymphedema   10 cm Proximal to Olecranon Process 29.5 cm   Olecranon Process 26.5 cm   15 cm Proximal to Ulnar Styloid Process 27 cm   10 cm Proximal to Ulnar Styloid Process 23.5 cm  Just Proximal to Ulnar Styloid Process 17 cm   Across Hand at [employer] 20.5 cm   At [place] of 2nd Digit 7 cm     Left Upper Extremity Lymphedema   10 cm Proximal to Olecranon Process 29.5 cm   Olecranon Process 26.5 cm   15 cm Proximal to Ulnar Styloid Process 27 cm   10 cm Proximal to Ulnar Styloid Process 23.5 cm   Just Proximal to Ulnar Styloid Process 17 cm   Across Hand at [employer] 20.5 cm   At [place] of 2nd Digit 7 cm                  [facility] Adult PT Treatment/Exercise - 07/03/16 0001      Self-Care   Self-Care Other Self-Care Comments   Other Self-Care Comments  Pt asking questions if we knew of anything to help with intimacy or severe constipation problems.  Gave flyer for Earlie Counts, PT pelvic floor therapist. Pt will follow up with Dr. Barry Dienes for referral   discussed use of compression sleeve for airplane travel and gave her information to get garments covered by [employer].      Shoulder Exercises: Isometric Strengthening   Flexion 5X5"   Flexion Limitations all shoulder isometrics done in standing    Extension 5X5"   ABduction 5X5"     Shoulder Exercises: Stretch   Other Shoulder Stretches standing dowel rod stretch for extension and internal rotation.  Also instructed to use a towel or belt                 PT Education - 07/03/16 1210    Education provided Yes   Education Details use of compression sleeve and gauntlet for flying , shoulder isometric exercise    Person(s) Educated Patient   Methods Explanation;Demonstration;Handout   Comprehension Verbalized understanding;Returned demonstration                [place] - 07/03/16 1217      CC Long Term Goal  #1   Title Patient will be independent in HEP for right shoulder ROM.   Status Achieved     CC Long Term Goal  #2   Title Pt. will report at least 50% decrease in right shoulder area pain when she reaches back or up.   Status Achieved     CC Long Term Goal  #3   Title Pt. will show at least 160 degrees of right shoulder active flexion for improved overhead reach.   Status Achieved     CC Long Term Goal  #4   Title Pt. will show at least 85 degrees of right shoulder external rotation for improved ADLs.   Status Achieved            Plan - 07/03/16 1211    Clinical Impression Statement Pt has maintained shoulder ROM although she feels a little tighter since she has not been doing her exercise. Feel that she will improve as she returns to her exercise program and is ready to maintain that program on her own. Arm circumferences show no signs of lymphedema, but she would benefit from a prophylactic sleeve and gauntlet that will be funded by [employer] since she has Medicare. She is interested in seeing a pelvic floor physical therapist and knows she  needs a referral from MD.  Goals are met and pt is ready to DC from this episode    PT Next Visit Plan Discharge  Consulted and Agree with Plan of Care Patient      Patient will benefit from skilled therapeutic intervention in order to improve the following deficits and impairments:     Visit Diagnosis: Stiffness of right shoulder, not elsewhere classified  Right shoulder pain, unspecified chronicity       G-Codes - 07/05/16 Apr 05, 1215    Functional Assessment Tool Used (Outpatient Only) clinical judgement   Functional Limitation Carrying, moving and handling objects   Carrying, Moving and Handling Objects Goal Status ([id number]) At least 1 percent but less than 20 percent impaired, limited or restricted   Carrying, Moving and Handling Objects Discharge Status [phone]) At least 1 percent but less than 20 percent impaired, limited or restricted      Problem List Patient Active Problem List   Diagnosis Date Noted  . Insect bite of arm, left 07/23/2014  . Allergic reaction 07/23/2014  . History of colonic polyps 05/30/2014  . Breast cancer of lower-outer quadrant of right female breast ([place]) 09/08/2013  . Unspecified menopausal and postmenopausal disorder 08/03/2012  . Lichen planus 02/06/7251  . UNSPECIFIED VITAMIN D DEFICIENCY 04/23/2010  . HYPERLIPIDEMIA 11/02/2007  . UNSPECIFIED ANEMIA 11/02/2007  . SICKLE CELL TRAIT 03/30/2007  . HYPERTENSION 03/30/2007  . GERD 03/30/2007  . IBS 03/30/2007  . SLEEP APNEA 03/30/2007  . ABNORMAL ELECTROCARDIOGRAM 03/30/2007  . History of positive PPD, untreated 03/30/2007   PHYSICAL THERAPY DISCHARGE SUMMARY  Visits from Start of Care: 6  Current functional level related to goals / functional outcomes: independent   Remaining deficits: Slight shoulder stiffness and discomfort    Education / Equipment: Home exercise, use of prophylactic dompression sleeve  Plan: Patient agrees to discharge.  Patient goals were met. Patient is being  discharged due to meeting the stated rehab goals.  ?????     Donato Heinz. Owens Shark PT  Norwood Levo Jul 05, 2016, 12:19 PM  [place], [place], [address] Phone: [phone]   Fax:  [phone]  Name: Cynthia Wagner MRN: [id number] Date of Birth: 1950-05-21

## 2016-07-31 ENCOUNTER — Ambulatory Visit: Payer: Medicare Other | Attending: General Surgery | Admitting: Physical Therapy

## 2016-07-31 ENCOUNTER — Encounter: Payer: Self-pay | Admitting: Physical Therapy

## 2016-07-31 DIAGNOSIS — M62838 Other muscle spasm: Secondary | ICD-10-CM

## 2016-07-31 DIAGNOSIS — M6281 Muscle weakness (generalized): Secondary | ICD-10-CM | POA: Diagnosis not present

## 2016-07-31 DIAGNOSIS — R278 Other lack of coordination: Secondary | ICD-10-CM | POA: Insufficient documentation

## 2016-07-31 NOTE — Patient Instructions (Addendum)
Knee-to-Chest Stretch: Unilateral    With hand behind right knee, pull knee in to chest until a comfortable stretch is felt in lower back and buttocks. Keep back relaxed. Hold _30___ seconds. Repeat __2__ times per set. Do __1__ sets per session. Do __1__ sessions per day.  http://orth.exer.us/126   Copyright  VHI. All rights reserved.  Piriformis Stretch, Supine    Lie supine, legs bent, feet flat. Raise one bent leg and, grasping ankle with both hands, pull leg toward opposite shoulder. Hold _30__ seconds.  Repeat _2__ times per session. Do _1__ sessions per day. Perform with other leg straight.  Copyright  VHI. All rights reserved.  Toileting Techniques for Bowel Movements (Defecation) Using your belly (abdomen) and pelvic floor muscles to have a bowel movement is usually instinctive.  Sometimes people can have problems with these muscles and have to relearn proper defecation (emptying) techniques.  If you have weakness in your muscles, organs that are falling out, decreased sensation in your pelvis, or ignore your urge to go, you may find yourself straining to have a bowel movement.  You are straining if you are: . holding your breath or taking in a huge gulp of air and holding it  . keeping your lips and jaw tensed and closed tightly . turning red in the face because of excessive pushing or forcing . developing or worsening your  hemorrhoids . getting faint while pushing . not emptying completely and have to defecate many times a day  If you are straining, you are actually making it harder for yourself to have a bowel movement.  Many people find they are pulling up with the pelvic floor muscles and closing off instead of opening the anus. Due to lack pelvic floor relaxation and coordination the abdominal muscles, one has to work harder to push the feces out.  Many people have never been taught how to defecate efficiently and effectively.  Notice what happens to your body when you  are having a bowel movement.  While you are sitting on the toilet pay attention to the following areas: . Jaw and mouth position . Angle of your hips   . Whether your feet touch the ground or not . Arm placement  . Spine position . Waist . Belly tension . Anus (opening of the anal canal)  An Evacuation/Defecation Plan   Here are the 4 basic points:  1. Lean forward enough for your elbows to rest on your knees 2. Support your feet on the floor or use a low stool if your feet don't touch the floor  3. Push out your belly as if you have swallowed a beach ball-you should feel a widening of your waist 4. Open and relax your pelvic floor muscles, rather than tightening around the anus      The following conditions my require modifications to your toileting posture:  . If you have had surgery in the past that limits your back, hip, pelvic, knee or ankle flexibility . Constipation   Your healthcare practitioner may make the following additional suggestions and adjustments:  1) Sit on the toilet  a) Make sure your feet are supported. b) Notice your hip angle and spine position-most people find it effective to lean forward or raise their knees, which can help the muscles around the anus to relax  c) When you lean forward, place your forearms on your thighs for support  2) Relax suggestions a) Breath deeply in through your nose and out slowly through your mouth  as if you are smelling the flowers and blowing out the candles. b) To become aware of how to relax your muscles, contracting and releasing muscles can be helpful.  Pull your pelvic floor muscles in tightly by using the image of holding back gas, or closing around the anus (visualize making a circle smaller) and lifting the anus up and in.  Then release the muscles and your anus should drop down and feel open. Repeat 5 times ending with the feeling of relaxation. c) Keep your pelvic floor muscles relaxed; let your belly bulge  out. d) The digestive tract starts at the mouth and ends at the anal opening, so be sure to relax both ends of the tube.  Place your tongue on the roof of your mouth with your teeth separated.  This helps relax your mouth and will help to relax the anus at the same time.  3) Empty (defecation) a) Keep your pelvic floor and sphincter relaxed, then bulge your anal muscles.  Make the anal opening wide.  b) Stick your belly out as if you have swallowed a beach ball. c) Make your belly wall hard using your belly muscles while continuing to breathe. Doing this makes it easier to open your anus. d) Breath out and give a grunt (or try using other sounds such as ahhhh, shhhhh, ohhhh or grrrrrrr).  4) Finish a) As you finish your bowel movement, pull the pelvic floor muscles up and in.  This will leave your anus in the proper place rather than remaining pushed out and down. If you leave your anus pushed out and down, it will start to feel as though that is normal and give you incorrect signals about needing to have a bowel movement.    Cynthia Wagner, PT [facility] [address] [place], [place] [address] About Abdominal Massage  Abdominal massage, also called external colon massage, is a self-treatment circular massage technique that can reduce and eliminate gas and ease constipation. The colon naturally contracts in waves in a clockwise direction starting from inside the right hip, moving up toward the ribs, across the belly, and down inside the left hip.  When you perform circular abdominal massage, you help stimulate your colon's normal wave pattern of movement called peristalsis.  It is most beneficial when done after eating.  Positioning You can practice abdominal massage with oil while lying down, or in the shower with soap.  Some people find that it is just as effective to do the massage through clothing while sitting or standing.  How to Massage Start by placing  your finger tips or knuckles on your right side, just inside your hip bone.  . Make small circular movements while you move upward toward your rib cage.   . Once you reach the bottom right side of your rib cage, take your circular movements across to the left side of the bottom of your rib cage.  . Next, move downward until you reach the inside of your left hip bone.  This is the path your feces travel in your colon. . Continue to perform your abdominal massage in this pattern for 10 minutes each day.     You can apply as much pressure as is comfortable in your massage.  Start gently and build pressure as you continue to practice.  Notice any areas of pain as you massage; areas of slight pain may be relieved as you massage, but if you have areas of significant or intense  pain, consult with your healthcare provider.  Other Considerations . General physical activity including bending and stretching can have a beneficial massage-like effect on the colon.  Deep breathing can also stimulate the colon because breathing deeply activates the same nervous system that supplies the colon.   . Abdominal massage should always be used in combination with a bowel-conscious diet that is high in the proper type of fiber for you, fluids (primarily water), and a regular exercise program.

## 2016-07-31 NOTE — Therapy (Signed)
[facility] [address] [place], [place], [address] Phone: [phone]   Fax:  [phone]  Physical Therapy Evaluation  Patient Details  Name: Cynthia Wagner MRN: [id number] Date of Birth: 1951-01-30 Referring Provider: Dr. Stark Klein  Encounter Date: 07/31/2016      PT End of Session - 07/31/16 1324    Visit Number 1   Number of Visits 10   Date for PT Re-Evaluation 09/25/16   Authorization Type g-code 10th visit; KX modifier is 6 th visit due to uing 9 already   PT Start Time 0815  came late   PT Stop Time 0845   PT Time Calculation (min) 30 min   Activity Tolerance Patient tolerated treatment well   Behavior During Therapy [facility] for tasks assessed/performed      Past Medical History:  Diagnosis Date  . Breast cancer ([place]) 09/2013   right  . Complication of anesthesia    states is sensitive to narcotics  . Dental bridge present    left upper; right upper and lower  . History of radiation therapy 10/27/13- 11/29/13   right breast 4800 cGy 24 sessions  . Hypertension    under control with med., has been on med. since 2006  . IBS (irritable bowel syndrome)   . PONV (postoperative nausea and vomiting)   . Scoliosis   . Sleep apnea    uses CPAP most nights    Past Surgical History:  Procedure Laterality Date  . BREAST LUMPECTOMY WITH NEEDLE LOCALIZATION AND AXILLARY SENTINEL LYMPH NODE BX Right 09/14/2013   Procedure: BREAST LUMPECTOMY WITH NEEDLE LOCALIZATION AND AXILLARY SENTINEL LYMPH NODE BX;  Surgeon: Stark Klein, MD;  Location: [place];  Service: General;  Laterality: Right;  . CARPAL TUNNEL RELEASE Bilateral 1991  . CESAREAN SECTION     x 2  . COLONOSCOPY  2003; 2006  . colonoscopy with polypectomy  12/2013   Dr Collene Mares; due 2021  . ESOPHAGOGASTRODUODENOSCOPY  10/11/2003  . HAMMER TOE SURGERY Left 06/25/2011  . HEMORRHOID SURGERY    . VAGINAL HYSTERECTOMY  1995   ovaries  intact, fibroids  . VULVA /PERINEUM BIOPSY      There were no vitals filed for this visit.       Subjective Assessment - 07/31/16 0815    Subjective Patient reports trouble with constipation.  She has tried many medications.  It is termed IBS. Patient drinks alot of water and still has problems with constipation. Tries to have 1 bowel movement every other day.  If she waits, she feels like she will have a medication.    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  HTN controlled with meds and diet; IBS; sleep apnea and uses CPAP.  Some back problems and sees a chriopractor.   Patient Stated Goals reduce constipation   Currently in Pain? No/denies   Multiple Pain Sites No            [facility] PT Assessment - 07/31/16 0001      Assessment   Medical Diagnosis C50.11 [place] neoplasm of lower - outer quadrant of right female breast   Referring Provider Dr. Stark Klein   Onset Date/Surgical Date 02/05/16   Prior Therapy None     Precautions   Precautions Other (comment)   Precaution Comments Cancer     Restrictions   Weight Bearing Restrictions No     Balance Screen  Has the patient fallen in the past 6 months No   Has the patient had a decrease in activity level because of a fear of falling?  No   Is the patient reluctant to leave their home because of a fear of falling?  No     Home Ecologist residence     Prior Function   Level of Independence Independent   Vocation Retired   Leisure exercise     Cognition   Overall Cognitive Status Within Functional Limits for tasks assessed     Observation/Other Assessments   Focus on Therapeutic Outcomes (FOTO)  62% limitation bowel   goal is 51% limitation     Posture/Postural Control   Posture/Postural Control Postural limitations   Posture Comments left ilium is higher, scoliosis     ROM / Strength   AROM /  PROM / Strength AROM;PROM;Strength     AROM   Lumbar - Right Side Bend decreased by 25%     Strength   Right Hip ABduction 3+/5   Left Hip Extension 4/5   Left Hip ABduction 3+/5            Objective measurements completed on examination: See above findings.        Pelvic Floor Special Questions - 07/31/16 0001    Prior Pregnancies Yes   Number of Pregnancies 2   Number of C-Sections 2   Currently Sexually Active Yes   Is this Painful --  sometimes   Urinary Leakage No   Skin Integrity Intact;Hemorroids   Pelvic Floor Internal Exam Patient confirms identification and approves PT to assess muscle intergrity   Exam Type Rectal   Palpation tight band in the internal sphinter area on left and posterior   Strength weak squeeze, no lift                  PT Education - 07/31/16 1323    Education provided Yes   Education Details abdominal massage, toileting technique, stretches   Person(s) Educated Patient   Methods Explanation;Demonstration;Handout   Comprehension Verbalized understanding;Returned demonstration          PT Short Term Goals - 07/31/16 1332      PT SHORT TERM GOAL #1   Title understand how to use the anal dilator to stretch the band of tissue   Time 4   Period Weeks   Status New     PT SHORT TERM GOAL #2   Title understand how to perfrom abdominal soft tissue to work on [employer] of the small intestines   Time 4   Period Weeks   Status New           PT Long Term Goals - 07/31/16 1333      PT LONG TERM GOAL #1   Title independent with initial HEP   Time 8   Period Weeks     PT LONG TERM GOAL #2   Title ability to fully empty her bowels due to able to push the bowels out   Time 8   Period Weeks   Status New     PT LONG TERM GOAL #3   Title ability to have a bowel movement without having to take laxative due to increased pelvic floor strength to 3/5   Time 8   Period Weeks   Status New     PT LONG TERM  GOAL #4   Title ability to push the therapist finger out of  the anal canal due to improved pelvic floor coordination   Time 8   Period Weeks   Status New     PT LONG TERM GOAL #5   Title FOTO score </= 51% limitation   Time 8   Period Weeks   Status New           Long Term Clinic Goals - 07/03/16 1217      CC Long Term Goal  #1   Title Patient will be independent in HEP for right shoulder ROM.   Status Achieved     CC Long Term Goal  #2   Title Pt. will report at least 50% decrease in right shoulder area pain when she reaches back or up.   Status Achieved     CC Long Term Goal  #3   Title Pt. will show at least 160 degrees of right shoulder active flexion for improved overhead reach.   Status Achieved     CC Long Term Goal  #4   Title Pt. will show at least 85 degrees of right shoulder external rotation for improved ADLs.   Status Achieved             Plan - 07/31/16 1326    Clinical Impression Statement Patient is a 66 year old female with constipation issues.  Patient has to take a laxative to assist her with a bowel movement.  Patient pelvic floor strength is 2/5 in the anal region.  A tight band on the left posterior area of the anus has a tightband where the internal sphincter.  Patient has difficulty with pushing out the therapist finger with a bowel movement.  Patient  has not pain.  Patient has some pain with intercourse.  Patient has a history of breast cancer with radiation and chemotherapy.  Patient will benefit from skilled therapy to improve mobility of the anal tissue and improve ability to have a bowel movement.    History and Personal Factors relevant to plan of care: s/p right breast cancer with chemotherapy and radiation; hemorroids   Clinical Presentation Stable   Clinical Presentation due to: stable conditiion   Clinical Decision Making Low   Rehab Potential Excellent   Clinical Impairments Affecting Rehab Potential Cancer precautions   PT  Frequency 1x / week   PT Duration 8 weeks   PT Treatment/Interventions Biofeedback;Therapeutic activities;Therapeutic exercise;Neuromuscular re-education;Patient/family education;Scar mobilization;Manual techniques  use dilator   PT Next Visit Plan soft tissue work; go over how to use a dilator for the anus, squats    PT Home Exercise Plan progress as needed   Consulted and Agree with Plan of Care Patient      Patient will benefit from skilled therapeutic intervention in order to improve the following deficits and impairments:  Decreased mobility, Decreased strength, Increased muscle spasms, Increased fascial restricitons, Decreased coordination  Visit Diagnosis: Muscle weakness (generalized) - Plan: PT plan of care cert/re-cert  Other lack of coordination - Plan: PT plan of care cert/re-cert  Other muscle spasm - Plan: PT plan of care cert/re-cert      G-Codes - 05/39/76 1336    Functional Assessment Tool Used (Outpatient Only) FOTO score is 62% limitation  goal is 51% limitation   Functional Limitation Other PT primary   Other PT Primary Current Status (B3419) At least 60 percent but less than 80 percent impaired, limited or restricted   Other PT Primary Goal Status (F7902) At least 40 percent but less than 60 percent impaired, limited or  restricted       Problem List Patient Active Problem List   Diagnosis Date Noted  . Insect bite of arm, left 07/23/2014  . Allergic reaction 07/23/2014  . History of colonic polyps 05/30/2014  . Breast cancer of lower-outer quadrant of right female breast ([place]) 09/08/2013  . Unspecified menopausal and postmenopausal disorder 08/03/2012  . Lichen planus 57/32/2025  . UNSPECIFIED VITAMIN D DEFICIENCY 04/23/2010  . HYPERLIPIDEMIA 11/02/2007  . UNSPECIFIED ANEMIA 11/02/2007  . SICKLE CELL TRAIT 03/30/2007  . HYPERTENSION 03/30/2007  . GERD 03/30/2007  . IBS 03/30/2007  . SLEEP APNEA 03/30/2007  . ABNORMAL ELECTROCARDIOGRAM 03/30/2007   . History of positive PPD, untreated 03/30/2007    Earlie Counts, PT 07/31/16 1:38 PM   [facility] [address] [place], [place], [address] Phone: [phone]   Fax:  [phone]  Name: Cynthia Wagner MRN: [id number] Date of Birth: Dec 08, 1950

## 2016-08-12 ENCOUNTER — Ambulatory Visit: Payer: Medicare Other | Attending: General Surgery | Admitting: Physical Therapy

## 2016-08-12 ENCOUNTER — Encounter: Payer: Self-pay | Admitting: Physical Therapy

## 2016-08-12 DIAGNOSIS — M6281 Muscle weakness (generalized): Secondary | ICD-10-CM | POA: Diagnosis not present

## 2016-08-12 DIAGNOSIS — R278 Other lack of coordination: Secondary | ICD-10-CM | POA: Diagnosis present

## 2016-08-12 DIAGNOSIS — M62838 Other muscle spasm: Secondary | ICD-10-CM | POA: Diagnosis present

## 2016-08-12 NOTE — Therapy (Signed)
[facility] [address] [place], [place], [address] Phone: [phone]   Fax:  [phone]  Physical Therapy Treatment  Patient Details  Name: Cynthia Wagner MRN: [id number] Date of Birth: [birth year] Referring Provider: Dr. Stark Klein  Encounter Date: 08/12/2016      PT End of Session - 08/12/16 0844    Visit Number 2   Number of Visits 10   Date for PT Re-Evaluation 09/25/16   Authorization Type g-code 10th visit; KX modifier is 6 th visit due to uing 9 already   PT Start Time 0844   PT Stop Time 0926   PT Time Calculation (min) 42 min   Activity Tolerance Patient tolerated treatment well   Behavior During Therapy [facility] for tasks assessed/performed      Past Medical History:  Diagnosis Date  . Breast cancer ([place]) 09/2013   right  . Complication of anesthesia    states is sensitive to narcotics  . Dental bridge present    left upper; right upper and lower  . History of radiation therapy 10/27/13- 11/29/13   right breast 4800 cGy 24 sessions  . Hypertension    under control with med., has been on med. since 2006  . IBS (irritable bowel syndrome)   . PONV (postoperative nausea and vomiting)   . Scoliosis   . Sleep apnea    uses CPAP most nights    Past Surgical History:  Procedure Laterality Date  . BREAST LUMPECTOMY WITH NEEDLE LOCALIZATION AND AXILLARY SENTINEL LYMPH NODE BX Right 09/14/2013   Procedure: BREAST LUMPECTOMY WITH NEEDLE LOCALIZATION AND AXILLARY SENTINEL LYMPH NODE BX;  Surgeon: Stark Klein, MD;  Location: [place];  Service: General;  Laterality: Right;  . CARPAL TUNNEL RELEASE Bilateral 1991  . CESAREAN SECTION     x 2  . COLONOSCOPY  2003; 2006  . colonoscopy with polypectomy  12/2013   Dr Collene Mares; due 2021  . ESOPHAGOGASTRODUODENOSCOPY  10/11/2003  . HAMMER TOE SURGERY Left 06/25/2011  . HEMORRHOID SURGERY    . VAGINAL HYSTERECTOMY  1995   ovaries intact, fibroids  .  VULVA /PERINEUM BIOPSY      There were no vitals filed for this visit.      Subjective Assessment - 08/12/16 0851    Subjective Patient has not been able to get the dilator yet.     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  HTN controlled with meds and diet; IBS; sleep apnea and uses CPAP.  Some back problems and sees a chriopractor.   Patient Stated Goals reduce constipation   Currently in Pain? No/denies                         [facility] Adult PT Treatment/Exercise - 08/12/16 0001      Neuro Re-ed    Neuro Re-ed Details  bulge and push finger out with breathing not holding breath     Knee/Hip Exercises: Stretches   Piriformis Stretch Both;3 reps;30 seconds  sitting     Manual Therapy   Manual Therapy Myofascial release;Soft tissue mobilization;Internal Pelvic Floor   Manual therapy comments pt informed and consent given to perform internal STM recally   Soft tissue mobilization internal and external sphincter   Myofascial Release abdominal massage   Internal Pelvic Floor internal and external anal sphinkcter  PT Short Term Goals - 08/12/16 0960      PT SHORT TERM GOAL #1   Title understand how to use the anal dilator to stretch the band of tissue   Time 4   Period Weeks   Status On-going     PT SHORT TERM GOAL #2   Title understand how to perfrom abdominal soft tissue to work on [employer] of the small intestines   Time 4   Period Weeks   Status Achieved           PT Long Term Goals - 07/31/16 1333      PT LONG TERM GOAL #1   Title independent with initial HEP   Time 8   Period Weeks     PT LONG TERM GOAL #2   Title ability to fully empty her bowels due to able to push the bowels out   Time 8   Period Weeks   Status New     PT LONG TERM GOAL #3   Title ability to have a bowel movement without having to take laxative  due to increased pelvic floor strength to 3/5   Time 8   Period Weeks   Status New     PT LONG TERM GOAL #4   Title ability to push the therapist finger out of the anal canal due to improved pelvic floor coordination   Time 8   Period Weeks   Status New     PT LONG TERM GOAL #5   Title FOTO score </= 51% limitation   Time 8   Period Weeks   Status [place] [facility] Goals - 07/03/16 1217      CC Long Term Goal  #1   Title Patient will be independent in HEP for right shoulder ROM.   Status Achieved     CC Long Term Goal  #2   Title Pt. will report at least 50% decrease in right shoulder area pain when she reaches back or up.   Status Achieved     CC Long Term Goal  #3   Title Pt. will show at least 160 degrees of right shoulder active flexion for improved overhead reach.   Status Achieved     CC Long Term Goal  #4   Title Pt. will show at least 85 degrees of right shoulder external rotation for improved ADLs.   Status Achieved            Plan - 08/12/16 0845    Clinical Impression Statement Patient has had some success with the abdominal massage.  She understands how to perfrom and has been doing it.  She has not had time to work on the dilator due to a lot going on.  She has tightness of the internal and external sphincter muscles that released slightly with STM.  Pt reviewed stretches and felt a good stretch with pirifrormis stretch in sitting.  She will benefit rom skilled PT for improved soft tissue length and to learn how to relax pelvic floor muscles for improved bowel funciton.   Rehab Potential Excellent   PT Treatment/Interventions Biofeedback;Therapeutic activities;Therapeutic exercise;Neuromuscular re-education;Patient/family education;Scar mobilization;Manual techniques   Consulted and Agree with Plan of Care Patient      Patient will benefit from skilled therapeutic intervention in order to improve the following deficits and impairments:   Decreased mobility, Decreased strength, Increased muscle spasms, Increased fascial restricitons, Decreased coordination  Visit  Diagnosis: Muscle weakness (generalized)  Other lack of coordination  Other muscle spasm     Problem List Patient Active Problem List   Diagnosis Date Noted  . Insect bite of arm, left 07/23/2014  . Allergic reaction 07/23/2014  . History of colonic polyps 05/30/2014  . Breast cancer of lower-outer quadrant of right female breast ([place]) 09/08/2013  . Unspecified menopausal and postmenopausal disorder 08/03/2012  . Lichen planus 35/67/0141  . UNSPECIFIED VITAMIN D DEFICIENCY 04/23/2010  . HYPERLIPIDEMIA 11/02/2007  . UNSPECIFIED ANEMIA 11/02/2007  . SICKLE CELL TRAIT 03/30/2007  . HYPERTENSION 03/30/2007  . GERD 03/30/2007  . IBS 03/30/2007  . SLEEP APNEA 03/30/2007  . ABNORMAL ELECTROCARDIOGRAM 03/30/2007  . History of positive PPD, untreated 03/30/2007    Zannie Cove, PT 08/12/2016, 10:02 AM  [facility] [address] [place], [place], [address] Phone: [phone]   Fax:  [phone]  Name: Cynthia Wagner MRN: [id number] Date of Birth: Apr 20, 1950

## 2016-08-19 ENCOUNTER — Encounter: Payer: Self-pay | Admitting: Physical Therapy

## 2016-08-19 ENCOUNTER — Ambulatory Visit: Payer: Medicare Other | Admitting: Physical Therapy

## 2016-08-19 DIAGNOSIS — M6281 Muscle weakness (generalized): Secondary | ICD-10-CM

## 2016-08-19 DIAGNOSIS — R278 Other lack of coordination: Secondary | ICD-10-CM

## 2016-08-19 DIAGNOSIS — M62838 Other muscle spasm: Secondary | ICD-10-CM

## 2016-08-19 NOTE — Patient Instructions (Addendum)
Guide to Using a Anal Dilator ( No radiation) The anal dilator is used to stretch the anal canal from surgery or radiation. Is  a smooth plastic cylinder that is 6 inches long. It is used to stretch the anal canal to assist in having a bowel movement.  1.  Use the dilator your therapist has directed you to .Place dilator in warm water for 2 min..  2. Lubricate both the anus and tip of the dilator.  Do not use petroleum based lubricant due to increased risk of infection and more difficult to wash off.  3. Lay on your side to insert the tip of the dilator at a right angle to the rectum and lightly insert the dilator.  Exhale as you gently ease the dilator into the anal canal.  Breathe in deeply and inch the dilator deeper. See below for frequency: Anal stenosis- holds dilator in 2-3 minutes 2 times daily for 3-4 months 4. Remove the dilator as you are bearing down to push it out.  Wash your hands and the dilator with warm water and soap. Let the dilator dry completely to prevent bacteria build-up.   Lubrication . Used for intercourse to reduce friction . Avoid ones that have glycerin, warming gels, tingling gels, icing or cooling gel, scented . Avoid parabens due to a preservative similar to female sex hormone . May need to be reapplied once or several times during sexual activity . Can be applied to both partners genitals prior to vaginal penetration to minimize friction or irritation . Prevent irritation and mucosal tears that cause post coital pain and increased the risk of vaginal and urinary tract infections . Oil-based lubricants cannot be used with condoms due to breaking them down.  Least likely to irritate vaginal tissue.  . Plant based-lubes are safe . Silicone-based lubrication are thicker and last long and used for post-menopausal women  Use coconut oil on the anal region daily to keep the skin pliable.   Vaginal Lubricators Here is a list of some suggested lubricators you can use for  intercourse. Use the most hypoallergenic product.  You can place on you or your partner.   Astroglide natural in green box  K-Y Intrique- silicone base  Good Clean Love -[employer], [employer], [employer] (water based)  Slippery Stuff  Sylk, Sliquid Natural H2O ( good  if frequent UTI's)  Blossom Organics (www.blossom-organics.com)  Samul Dada, Coconut oil  PJur Woman Nude- water based lubricant, [employer], good for cancer patients with radiation  Yes lubricant- Terry . Wet Platinum-Silicone, [employer], [employer] Things to avoid in lubricants are glycerin, warming gels, tingling gels, icing or cooling  gels, and scented gels.  Also avoid Vaseline. KY jelly, Replens, and Astroglide kills good bacteria(lactobacilli)  Things to avoid in the vaginal area . Do not use things to irritate the vulvar area . No lotions . No soaps; can use Aveeno or Calendula cleanser if needed. Must be gentle . No deodorants . No douches . Good to sleep without underwear to let the vaginal area to air out . No scrubbing: spread the lips to let warm water rinse over labias and pat dry   Bear Down    Tighten the pelvic floor then Exhaling, bear down as if to have a bowel movement. Repeat _5__ times. Do __2-3_ times a day.  Copyright  VHI. All rights reserved.  [facility] [address], [place], [place] [address] Phone # [phone] Fax [phone]

## 2016-08-19 NOTE — Therapy (Signed)
[facility] [address], [place], [place], [address] Phone: [phone]   Fax:  [phone]  Physical Therapy Treatment  Patient Details  Name: Cynthia Wagner MRN: [id number] Date of Birth: 25-Nov-1950 Referring Provider: Dr. Stark Klein  Encounter Date: 08/19/2016      PT End of Session - 08/19/16 1017    Visit Number 3   Number of Visits 10   Date for PT Re-Evaluation 09/25/16   Authorization Type g-code 10th visit; KX modifier is 6 th visit due to uing 9 already   PT Start Time 0930   PT Stop Time 1010   PT Time Calculation (min) 40 min   Activity Tolerance Patient tolerated treatment well   Behavior During Therapy [facility] for tasks assessed/performed      Past Medical History:  Diagnosis Date  . Breast cancer ([place]) 09/2013   right  . Complication of anesthesia    states is sensitive to narcotics  . Dental bridge present    left upper; right upper and lower  . History of radiation therapy 10/27/13- 11/29/13   right breast 4800 cGy 24 sessions  . Hypertension    under control with med., has been on med. since 2006  . IBS (irritable bowel syndrome)   . PONV (postoperative nausea and vomiting)   . Scoliosis   . Sleep apnea    uses CPAP most nights    Past Surgical History:  Procedure Laterality Date  . BREAST LUMPECTOMY WITH NEEDLE LOCALIZATION AND AXILLARY SENTINEL LYMPH NODE BX Right 09/14/2013   Procedure: BREAST LUMPECTOMY WITH NEEDLE LOCALIZATION AND AXILLARY SENTINEL LYMPH NODE BX;  Surgeon: Stark Klein, MD;  Location: [place];  Service: General;  Laterality: Right;  . CARPAL TUNNEL RELEASE Bilateral 1991  . CESAREAN SECTION     x 2  . COLONOSCOPY  2003; 2006  . colonoscopy with polypectomy  12/2013   Dr Collene Mares; due 2021  . ESOPHAGOGASTRODUODENOSCOPY  10/11/2003  . HAMMER TOE SURGERY Left 06/25/2011  . HEMORRHOID SURGERY    . VAGINAL HYSTERECTOMY  1995   ovaries intact, fibroids   . VULVA /PERINEUM BIOPSY      There were no vitals filed for this visit.      Subjective Assessment - 08/19/16 0931    Subjective Unable to tell about the constipation.  I have had alot of family medical emergencies. I had a bout of hemmoriods.    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  HTN controlled with meds and diet; IBS; sleep apnea and uses CPAP.  Some back problems and sees a chriopractor.   Patient Stated Goals reduce constipation   Currently in Pain? No/denies                      Pelvic Floor Special Questions - 08/19/16 0001    Pelvic Floor Internal Exam Patient confirms identification and approves PT to assess muscle intergrity   Exam Type Rectal   Palpation tight band in the internal sphinter area on left and posterior   Strength weak squeeze, no lift           [facility] Adult PT Treatment/Exercise - 08/19/16 0001      Self-Care   Self-Care Other Self-Care Comments   Other Self-Care Comments  how to use the anal dilator, lubrincants with dilator, using coconut oil on anal region,  Manual Therapy   Manual Therapy Internal Pelvic Floor   Internal Pelvic Floor going through the anal region to perform soft tissue work to the internal sphincter, puborectalis, distraction of the tissue along the coccyx area in left sidely                PT Education - 08/19/16 1014    Education provided Yes   Education Details how to use the anal dilator, lubricant samples given, contract and relax the pelvic floor   Person(s) Educated Patient   Methods Explanation;Demonstration;Verbal cues;Handout   Comprehension Returned demonstration;Verbalized understanding          PT Short Term Goals - 08/19/16 1021      PT SHORT TERM GOAL #1   Title understand how to use the anal dilator to stretch the band of tissue   Time 4   Period Weeks   Status On-going  just learned      PT SHORT TERM GOAL #2   Title understand how to perfrom abdominal soft tissue to work on [employer] of the small intestines   Time 4   Period Weeks   Status Achieved           PT Long Term Goals - 07/31/16 1333      PT LONG TERM GOAL #1   Title independent with initial HEP   Time 8   Period Weeks     PT LONG TERM GOAL #2   Title ability to fully empty her bowels due to able to push the bowels out   Time 8   Period Weeks   Status New     PT LONG TERM GOAL #3   Title ability to have a bowel movement without having to take laxative due to increased pelvic floor strength to 3/5   Time 8   Period Weeks   Status New     PT LONG TERM GOAL #4   Title ability to push the therapist finger out of the anal canal due to improved pelvic floor coordination   Time 8   Period Weeks   Status New     PT LONG TERM GOAL #5   Title FOTO score </= 51% limitation   Time 8   Period Weeks   Status [facility] Goals - 07/03/16 1217      CC Long Term Goal  #1   Title Patient will be independent in HEP for right shoulder ROM.   Status Achieved     CC Long Term Goal  #2   Title Pt. will report at least 50% decrease in right shoulder area pain when she reaches back or up.   Status Achieved     CC Long Term Goal  #3   Title Pt. will show at least 160 degrees of right shoulder active flexion for improved overhead reach.   Status Achieved     CC Long Term Goal  #4   Title Pt. will show at least 85 degrees of right shoulder external rotation for improved ADLs.   Status Achieved            Plan - 08/19/16 1017    Clinical Impression Statement Patient has not been able to achieve her goals due to her dealing with family emergencies.  Patient has gotten the anal dilators in the mail and understands how to use them.  Patient continues to have a tight band around the  left posterior internal sphincter.  Patient is able to contract and relax the anal  area minimally.  Patient will benefit from skilled therapy for improved soft tissue length and to learn how to relax pelvic floor muscles for improved bowel function.   Rehab Potential Excellent   Clinical Impairments Affecting Rehab Potential Cancer precautions   PT Frequency 1x / week   PT Duration 8 weeks   PT Treatment/Interventions Biofeedback;Therapeutic activities;Therapeutic exercise;Neuromuscular re-education;Patient/family education;Scar mobilization;Manual techniques   PT Next Visit Plan soft tissue work; , squats, see how it is going with the dilator, see if patient is able to empty her bowels,    PT Home Exercise Plan progress as needed   Recommended Other Services initial cert signed on 2/42/6834   Consulted and Agree with Plan of Care Patient      Patient will benefit from skilled therapeutic intervention in order to improve the following deficits and impairments:  Decreased mobility, Decreased strength, Increased muscle spasms, Increased fascial restricitons, Decreased coordination  Visit Diagnosis: Muscle weakness (generalized)  Other lack of coordination  Other muscle spasm     Problem List Patient Active Problem List   Diagnosis Date Noted  . Insect bite of arm, left 07/23/2014  . Allergic reaction 07/23/2014  . History of colonic polyps 05/30/2014  . Breast cancer of lower-outer quadrant of right female breast ([place]) 09/08/2013  . Unspecified menopausal and postmenopausal disorder 08/03/2012  . Lichen planus 19/62/2297  . UNSPECIFIED VITAMIN D DEFICIENCY 04/23/2010  . HYPERLIPIDEMIA 11/02/2007  . UNSPECIFIED ANEMIA 11/02/2007  . SICKLE CELL TRAIT 03/30/2007  . HYPERTENSION 03/30/2007  . GERD 03/30/2007  . IBS 03/30/2007  . SLEEP APNEA 03/30/2007  . ABNORMAL ELECTROCARDIOGRAM 03/30/2007  . History of positive PPD, untreated 03/30/2007    Earlie Counts, PT 08/19/16 10:23 AM   [facility] [address] [place], [place], [address] Phone: [phone]   Fax:  [phone]  Name: KAITLAN BIN MRN: [id number] Date of Birth: 01-10-1951

## 2016-09-03 ENCOUNTER — Ambulatory Visit: Payer: Medicare Other | Admitting: Physical Therapy

## 2016-09-03 ENCOUNTER — Encounter: Payer: Self-pay | Admitting: Physical Therapy

## 2016-09-03 DIAGNOSIS — R278 Other lack of coordination: Secondary | ICD-10-CM

## 2016-09-03 DIAGNOSIS — M62838 Other muscle spasm: Secondary | ICD-10-CM

## 2016-09-03 DIAGNOSIS — M6281 Muscle weakness (generalized): Secondary | ICD-10-CM

## 2016-09-03 NOTE — Therapy (Signed)
[facility] [address] [place], [place], [address] Phone: [phone]   Fax:  [phone]  Physical Therapy Treatment  Patient Details  Name: Cynthia Wagner MRN: [id number] Date of Birth: 1950-10-13 Referring Provider: Dr. Stark Klein  Encounter Date: 09/03/2016      PT End of Session - 09/03/16 1012    Visit Number 4   Number of Visits 10   Date for PT Re-Evaluation 09/25/16   Authorization Type g-code 10th visit; KX modifier is 6 th visit due to uing 9 already   PT Start Time 0930   PT Stop Time 1012   PT Time Calculation (min) 42 min   Activity Tolerance Patient tolerated treatment well   Behavior During Therapy [facility] for tasks assessed/performed      Past Medical History:  Diagnosis Date  . Breast cancer ([place]) 09/2013   right  . Complication of anesthesia    states is sensitive to narcotics  . Dental bridge present    left upper; right upper and lower  . History of radiation therapy 10/27/13- 11/29/13   right breast 4800 cGy 24 sessions  . Hypertension    under control with med., has been on med. since 2006  . IBS (irritable bowel syndrome)   . PONV (postoperative nausea and vomiting)   . Scoliosis   . Sleep apnea    uses CPAP most nights    Past Surgical History:  Procedure Laterality Date  . BREAST LUMPECTOMY WITH NEEDLE LOCALIZATION AND AXILLARY SENTINEL LYMPH NODE BX Right 09/14/2013   Procedure: BREAST LUMPECTOMY WITH NEEDLE LOCALIZATION AND AXILLARY SENTINEL LYMPH NODE BX;  Surgeon: Stark Klein, MD;  Location: [place] [facility];  Service: General;  Laterality: Right;  . CARPAL TUNNEL RELEASE Bilateral 1991  . CESAREAN SECTION     x 2  . COLONOSCOPY  2003; 2006  . colonoscopy with polypectomy  12/2013   Dr Collene Mares; due 2021  . ESOPHAGOGASTRODUODENOSCOPY  10/11/2003  . HAMMER TOE SURGERY Left 06/25/2011  . HEMORRHOID SURGERY    . VAGINAL HYSTERECTOMY  1995   ovaries intact, fibroids   . VULVA /PERINEUM BIOPSY      There were no vitals filed for this visit.      Subjective Assessment - 09/03/16 0931    Subjective I just got back to [place]. I was only able to use the dilator 2 times. I feel better and I have tools to do. I am straining 50% less. I use the Teami blend tea at night and I get the colon.    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  HTN controlled with meds and diet; IBS; sleep apnea and uses CPAP.  Some back problems and sees a chriopractor.   Patient Stated Goals reduce constipation   Currently in Pain? No/denies                      Pelvic Floor Special Questions - 09/03/16 0001    Pelvic Floor Internal Exam Patient confirms identification and approves PT to assess muscle intergrity   Exam Type Rectal           [facility] Adult PT Treatment/Exercise - 09/03/16 0001      Self-Care   Self-Care Other Self-Care Comments   Other Self-Care Comments  toilet meditation to work on [place] Internal Pelvic Floor  Manual therapy comments tapping of the anal sphincter to improve circular contraction   Internal Pelvic Floor going through the anal region to perform soft tissue work to the internal sphincter, puborectalis, distraction of the tissue along the coccyx area in left sidely                PT Education - 09/03/16 1012    Education provided Yes   Education Details toilet meditation, squatting to relax the pelvic floor muscles   Person(s) Educated Patient   Methods Explanation;Demonstration;Verbal cues;Handout   Comprehension Returned demonstration;Verbalized understanding          PT Short Term Goals - 08/19/16 1021      PT SHORT TERM GOAL #1   Title understand how to use the anal dilator to stretch the band of tissue   Time 4   Period Weeks   Status On-going  just learned     PT SHORT TERM  GOAL #2   Title understand how to perfrom abdominal soft tissue to work on [employer] of the small intestines   Time 4   Period Weeks   Status Achieved           PT Long Term Goals - 07/31/16 1333      PT LONG TERM GOAL #1   Title independent with initial HEP   Time 8   Period Weeks     PT LONG TERM GOAL #2   Title ability to fully empty her bowels due to able to push the bowels out   Time 8   Period Weeks   Status New     PT LONG TERM GOAL #3   Title ability to have a bowel movement without having to take laxative due to increased pelvic floor strength to 3/5   Time 8   Period Weeks   Status New     PT LONG TERM GOAL #4   Title ability to push the therapist finger out of the anal canal due to improved pelvic floor coordination   Time 8   Period Weeks   Status New     PT LONG TERM GOAL #5   Title FOTO score </= 51% limitation   Time 8   Period Weeks   Status New               Plan - 09/03/16 1013    Clinical Impression Statement Patient reports she strains 50% less.  She has met her STG's.  Patient is able to lift the anal canal with verbal cues now.  Patient continues to have a band in the internal sphincter but is less tight.  Patient understands how to perform toilet meditation correctly.  Patient will benefit from skilled therapy to improve band and learn how to relax the pelvic floor muscles for improve bowel function    Rehab Potential Excellent   Clinical Impairments Affecting Rehab Potential Cancer precautions   PT Frequency 1x / week   PT Duration 8 weeks   PT Treatment/Interventions Biofeedback;Therapeutic activities;Therapeutic exercise;Neuromuscular re-education;Patient/family education;Scar mobilization;Manual techniques   PT Next Visit Plan soft tissue work; , , see if patient is able to empty her bowels,    PT Home Exercise Plan progress as needed   Consulted and Agree with Plan of Care Patient      Patient will benefit from  skilled therapeutic intervention in order to improve the following deficits and impairments:  Decreased mobility, Decreased strength, Increased muscle spasms, Increased fascial restricitons, Decreased coordination  Visit Diagnosis: Muscle weakness (generalized)  Other lack of coordination  Other muscle spasm     Problem List Patient Active Problem List   Diagnosis Date Noted  . Insect bite of arm, left 07/23/2014  . Allergic reaction 07/23/2014  . History of colonic polyps 05/30/2014  . Breast cancer of lower-outer quadrant of right female breast ([place]) 09/08/2013  . Unspecified menopausal and postmenopausal disorder 08/03/2012  . Lichen planus 64/68/0321  . UNSPECIFIED VITAMIN D DEFICIENCY 04/23/2010  . HYPERLIPIDEMIA 11/02/2007  . UNSPECIFIED ANEMIA 11/02/2007  . SICKLE CELL TRAIT 03/30/2007  . HYPERTENSION 03/30/2007  . GERD 03/30/2007  . IBS 03/30/2007  . SLEEP APNEA 03/30/2007  . ABNORMAL ELECTROCARDIOGRAM 03/30/2007  . History of positive PPD, untreated 03/30/2007    Earlie Counts, PT 09/03/16 10:17 AM   [facility] [address], [place], [place], [address] Phone: [phone]   Fax:  [phone]  Name: Cynthia Wagner MRN: [id number] Date of Birth: [birth year]

## 2016-09-03 NOTE — Patient Instructions (Addendum)
Squat, Modified: Ankle Support    Place a thick book under heels. Slowly sink into a deep squat. Relax in this position for ___ seconds. Do ___ times a day.  Copyright  VHI. All rights reserved.  Squat, Modified: [employer] on edge of couch. Slide off edge into a deep squat, allowing couch to support back. Relax in this position for ___ seconds. Do ___ times a day.  Copyright  VHI. All rights reserved.  Squat, Modified: Door Support    Hold onto closed and sturdy door handle. Slowly sink into deep squat. Relax in this position for ___ seconds. Do ___ times a day.  Copyright  VHI. All rights reserved.  Squat prior to bowel movement.  Use any of the above positions with roll under feet. Hold 30 seconds with deep breathing.  Toilet meditation on you tube by Olene Floss  When on the commode do deep breathing with breathing in for 5 seconds then out for 5 seconds focusing on your breath.  Palms up and middle finger connects to thumb until you feel relex.  [facility] [address], [place], [place] [address] Phone # [phone] Fax [phone]

## 2016-09-09 ENCOUNTER — Ambulatory Visit: Payer: Medicare Other | Attending: General Surgery | Admitting: Physical Therapy

## 2016-09-09 ENCOUNTER — Encounter: Payer: Self-pay | Admitting: Physical Therapy

## 2016-09-09 DIAGNOSIS — R278 Other lack of coordination: Secondary | ICD-10-CM | POA: Diagnosis present

## 2016-09-09 DIAGNOSIS — M62838 Other muscle spasm: Secondary | ICD-10-CM | POA: Insufficient documentation

## 2016-09-09 DIAGNOSIS — M6281 Muscle weakness (generalized): Secondary | ICD-10-CM | POA: Diagnosis not present

## 2016-09-09 NOTE — Patient Instructions (Signed)
Piriformis Stretch, Supine    Lie supine, one ankle crossed onto opposite knee. Holding bottom leg behind knee, gently pull legs toward chest until stretch is felt in buttock of top leg. Hold __30_ seconds. For deeper stretch gently push top knee away from body.  Repeat _3_ times per session. Do __1_ sessions per day.  Copyright  VHI. All rights reserved.     Happy Baby  1. Position yourself as shown, grabbing onto the feet or behind the knees; you should feel a gentle stretch.  2. Breathe in and allow the pelvic floor muscles to relax.  3. Hold this position for 2-3 minutes or 5 breaths 3x

## 2016-09-09 NOTE — Therapy (Signed)
[facility] [address] [place], [place], [address] Phone: [phone]   Fax:  [phone]  Physical Therapy Treatment  Patient Details  Name: Cynthia Wagner MRN: [id number] Date of Birth: 1950-11-15 Referring Provider: Dr. Stark Klein  Encounter Date: 09/09/2016      PT End of Session - 09/09/16 0925    Visit Number 5   Number of Visits 10   Date for PT Re-Evaluation 09/25/16   Authorization Type g-code 10th visit; KX modifier is 6 th visit due to uing 9 already   PT Start Time 0925   PT Stop Time 1008   PT Time Calculation (min) 43 min   Activity Tolerance Patient tolerated treatment well   Behavior During Therapy [facility] for tasks assessed/performed      Past Medical History:  Diagnosis Date  . Breast cancer ([place]) 09/2013   right  . Complication of anesthesia    states is sensitive to narcotics  . Dental bridge present    left upper; right upper and lower  . History of radiation therapy 10/27/13- 11/29/13   right breast 4800 cGy 24 sessions  . Hypertension    under control with med., has been on med. since 2006  . IBS (irritable bowel syndrome)   . PONV (postoperative nausea and vomiting)   . Scoliosis   . Sleep apnea    uses CPAP most nights    Past Surgical History:  Procedure Laterality Date  . BREAST LUMPECTOMY WITH NEEDLE LOCALIZATION AND AXILLARY SENTINEL LYMPH NODE BX Right 09/14/2013   Procedure: BREAST LUMPECTOMY WITH NEEDLE LOCALIZATION AND AXILLARY SENTINEL LYMPH NODE BX;  Surgeon: Stark Klein, MD;  Location: [place];  Service: General;  Laterality: Right;  . CARPAL TUNNEL RELEASE Bilateral 1991  . CESAREAN SECTION     x 2  . COLONOSCOPY  2003; 2006  . colonoscopy with polypectomy  12/2013   Dr Collene Mares; due 2021  . ESOPHAGOGASTRODUODENOSCOPY  10/11/2003  . HAMMER TOE SURGERY Left 06/25/2011  . HEMORRHOID SURGERY    . VAGINAL HYSTERECTOMY  1995   ovaries intact, fibroids  .  VULVA /PERINEUM BIOPSY      There were no vitals filed for this visit.      Subjective Assessment - 09/09/16 0929    Subjective It has been hard to tell because stuff going on like traveling and had 2 funerals last week causing stress.     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  HTN controlled with meds and diet; IBS; sleep apnea and uses CPAP.  Some back problems and sees a chriopractor.   Patient Stated Goals reduce constipation   Currently in Pain? No/denies                         [facility] Adult PT Treatment/Exercise - 09/09/16 0001      Self-Care   Self-Care Other Self-Care Comments   Other Self-Care Comments  education on toileting techniques to rest between pushes with having BM taking relaxing breaths     Knee/Hip Exercises: Stretches   Piriformis Stretch Both;3 reps;30 seconds  sitting   Other Knee/Hip Stretches squatting position stretches, happy baby     Manual Therapy   Manual Therapy Internal Pelvic Floor   Manual therapy comments tapping of the anal sphincter to improve circular contraction   Internal Pelvic Floor going through the anal region  to perform soft tissue mobs  internal sphincter, puborectalis, distraction of the tissue along the coccyx area in left sidely                PT Education - 09/09/16 0949    Education provided Yes   Education Details piriformis and happy baby   Person(s) Educated Patient   Methods Explanation;Demonstration;Verbal cues;Handout   Comprehension Verbalized understanding;Returned demonstration          PT Short Term Goals - 09/09/16 1012      PT SHORT TERM GOAL #1   Title understand how to use the anal dilator to stretch the band of tissue   Time 4   Period Weeks   Status Achieved     PT SHORT TERM GOAL #2   Title understand how to perfrom abdominal soft tissue to work on [employer] of the small  intestines   Time 4   Period Weeks   Status Achieved           PT Long Term Goals - 09/09/16 1013      PT LONG TERM GOAL #1   Title independent with initial HEP   Time 8   Period Weeks   Status On-going     PT LONG TERM GOAL #2   Title ability to fully empty her bowels due to able to push the bowels out   Baseline sometimes not able   Time 8   Period Weeks   Status On-going     PT LONG TERM GOAL #3   Title ability to have a bowel movement without having to take laxative due to increased pelvic floor strength to 3/5   Time 8   Period Weeks   Status On-going     PT LONG TERM GOAL #4   Title ability to push the therapist finger out of the anal canal due to improved pelvic floor coordination   Time 8   Period Weeks   Status On-going     PT LONG TERM GOAL #5   Title FOTO score </= 51% limitation   Time 8   Period Weeks   Status New               Plan - 09/09/16 0926    Clinical Impression Statement Patient reports she is still better even though she has had some stress in her life.  She needs tactile and verbal cues to keep puborectalis relaxed, and cues for breathing slowly helped pelvic floor stay more relaxed.  Pt will continue to benefit from skilled PT for improved pelvic floor relaxation and coordination during BM.   Rehab Potential Excellent   PT Treatment/Interventions Biofeedback;Therapeutic activities;Therapeutic exercise;Neuromuscular re-education;Patient/family education;Scar mobilization;Manual techniques   PT Next Visit Plan f/u on relaxing in between pushes with toileting, review stretches as needed, soft tissue mobs internal and keeping puborectalis relaxed with pushing   PT Home Exercise Plan progress as needed   Consulted and Agree with Plan of Care Patient      Patient will benefit from skilled therapeutic intervention in order to improve the following deficits and impairments:  Decreased mobility, Decreased strength, Increased muscle  spasms, Increased fascial restricitons, Decreased coordination  Visit Diagnosis: Muscle weakness (generalized)  Other lack of coordination  Other muscle spasm     Problem List Patient Active Problem List   Diagnosis Date Noted  . Insect bite of arm, left 07/23/2014  . Allergic reaction 07/23/2014  . History of colonic polyps 05/30/2014  . Breast cancer  of lower-outer quadrant of right female breast ([place]) 09/08/2013  . Unspecified menopausal and postmenopausal disorder 08/03/2012  . Lichen planus 49/17/9150  . UNSPECIFIED VITAMIN D DEFICIENCY 04/23/2010  . HYPERLIPIDEMIA 11/02/2007  . UNSPECIFIED ANEMIA 11/02/2007  . SICKLE CELL TRAIT 03/30/2007  . HYPERTENSION 03/30/2007  . GERD 03/30/2007  . IBS 03/30/2007  . SLEEP APNEA 03/30/2007  . ABNORMAL ELECTROCARDIOGRAM 03/30/2007  . History of positive PPD, untreated 03/30/2007    Zannie Cove, PT 09/09/2016, 10:16 AM  [facility] [address] [place], [place], [address] Phone: [phone]   Fax:  [phone]  Name: Cynthia Wagner MRN: [id number] Date of Birth: 06-30-1950

## 2016-09-16 ENCOUNTER — Encounter: Payer: Self-pay | Admitting: Physical Therapy

## 2016-09-16 ENCOUNTER — Ambulatory Visit: Payer: Medicare Other | Admitting: Physical Therapy

## 2016-09-16 DIAGNOSIS — M62838 Other muscle spasm: Secondary | ICD-10-CM

## 2016-09-16 DIAGNOSIS — M6281 Muscle weakness (generalized): Secondary | ICD-10-CM

## 2016-09-16 DIAGNOSIS — R278 Other lack of coordination: Secondary | ICD-10-CM

## 2016-09-16 NOTE — Therapy (Signed)
[facility] [address] [place], [place], [address] Phone: [phone]   Fax:  [phone]  Physical Therapy Treatment  Patient Details  Name: Cynthia Wagner MRN: [id number] Date of Birth: 06/22/1950 Referring Provider: Dr. Stark Klein  Encounter Date: 09/16/2016      PT End of Session - 09/16/16 0946    Visit Number 6   Number of Visits 10   Date for PT Re-Evaluation 11/20/16   Authorization Type g-code 10th visit; KX modifier is 6 th visit due to uing 9 already   PT Start Time 0930   PT Stop Time 1010   PT Time Calculation (min) 40 min   Activity Tolerance Patient tolerated treatment well   Behavior During Therapy [facility] for tasks assessed/performed      Past Medical History:  Diagnosis Date  . Breast cancer ([place]) 09/2013   right  . Complication of anesthesia    states is sensitive to narcotics  . Dental bridge present    left upper; right upper and lower  . History of radiation therapy 10/27/13- 11/29/13   right breast 4800 cGy 24 sessions  . Hypertension    under control with med., has been on med. since 2006  . IBS (irritable bowel syndrome)   . PONV (postoperative nausea and vomiting)   . Scoliosis   . Sleep apnea    uses CPAP most nights    Past Surgical History:  Procedure Laterality Date  . BREAST LUMPECTOMY WITH NEEDLE LOCALIZATION AND AXILLARY SENTINEL LYMPH NODE BX Right 09/14/2013   Procedure: BREAST LUMPECTOMY WITH NEEDLE LOCALIZATION AND AXILLARY SENTINEL LYMPH NODE BX;  Surgeon: Stark Klein, MD;  Location: [place];  Service: General;  Laterality: Right;  . CARPAL TUNNEL RELEASE Bilateral 1991  . CESAREAN SECTION     x 2  . COLONOSCOPY  2003; 2006  . colonoscopy with polypectomy  12/2013   Dr Collene Mares; due 2021  . ESOPHAGOGASTRODUODENOSCOPY  10/11/2003  . HAMMER TOE SURGERY Left 06/25/2011  . HEMORRHOID SURGERY    . VAGINAL HYSTERECTOMY  1995   ovaries intact, fibroids   . VULVA /PERINEUM BIOPSY      There were no vitals filed for this visit.      Subjective Assessment - 09/16/16 0935    Subjective I am still not able to eliminate like I should.  Patient reports constipation is 30% better.    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  HTN controlled with meds and diet; IBS; sleep apnea and uses CPAP.  Some back problems and sees a chriopractor.   Patient Stated Goals reduce constipation   Currently in Pain? No/denies            [facility] PT Assessment - 09/16/16 0001      Assessment   Medical Diagnosis C50.11 [place] neoplasm of lower - outer quadrant of right female breast   Onset Date/Surgical Date 02/05/16   Prior Therapy None     Precautions   Precautions Other (comment)   Precaution Comments Cancer     Restrictions   Weight Bearing Restrictions No     Home Environment   Living Environment Private residence     Prior Function   Level of [place] Retired   Leisure exercise     Cognition   Overall Cognitive Status Within Functional Limits for tasks assessed     Observation/Other Assessments  Focus on Therapeutic Outcomes (FOTO)  62% limitation bowel   goal is 51% limitation                  Pelvic Floor Special Questions - 09/16/16 0001    Number of Pregnancies 2   Number of C-Sections 2   Currently Sexually Active Yes   Is this Painful --  sometimes   Urinary Leakage No   Pelvic Floor Internal Exam Patient confirms identification and approves PT to assess muscle intergrity   Exam Type Rectal   Palpation tight band in the internal sphinter area on left and posterior  after therapy band loosened up   Strength fair squeeze, definite lift  needed tactile cues to have the muscles lift           [facility] Adult PT Treatment/Exercise - 09/16/16 0001      Neuro Re-ed    Neuro Re-ed Details  tapping of  the pevic floor muscles to improve the lift with contraction     Exercises   Exercises Other Exercises   Other Exercises  reviewed other alternatives to stretch pellvic floor with squat position in supine and laying on back with feet on wall     Manual Therapy   Manual Therapy Internal Pelvic Floor   Manual therapy comments tapping of the anal sphincter to improve circular contraction   Internal Pelvic Floor going through the anal region to perform soft tissue mobs  internal sphincter, puborectalis, distraction of the tissue along the coccyx area in left sidely                PT Education - 09/16/16 0939    Education provided Yes   Education Details discuss with patient on how to advance to the larger anal dilator   Person(s) Educated Patient   Methods Explanation   Comprehension Verbalized understanding          PT Short Term Goals - 09/09/16 1012      PT SHORT TERM GOAL #1   Title understand how to use the anal dilator to stretch the band of tissue   Time 4   Period Weeks   Status Achieved     PT SHORT TERM GOAL #2   Title understand how to perfrom abdominal soft tissue to work on [employer] of the small intestines   Time 4   Period Weeks   Status Achieved           PT Long Term Goals - 09/16/16 4431      PT LONG TERM GOAL #1   Title independent with initial HEP   Time 8   Period Weeks   Status On-going     PT LONG TERM GOAL #2   Title ability to fully empty her bowels due to able to push the bowels out   Baseline sometimes not able   Time 8   Period Weeks   Status On-going     PT LONG TERM GOAL #3   Title ability to have a bowel movement without having to take laxative due to increased pelvic floor strength to 3/5   Time 8   Period Weeks   Status On-going     PT LONG TERM GOAL #4   Title ability to push the therapist finger out of the anal canal due to improved pelvic floor coordination   Time 8   Period Weeks   Status On-going      PT LONG TERM GOAL #5  Title FOTO score </= 51% limitation   Time 8   Period Weeks   Status New               Plan - 09/16/16 1007    Clinical Impression Statement Patient reports her constipation is 30% better.  She is able to bear down to relax but is having trouble with lifting her anal canal. Patient pelvic floor will fatique easily. She still has a tight band along the internal sphincter posterior but it has improved.  Patient is still not able to push the therapist finger out. Pelvic floor strength has improved to 3/5. Patient is still having difficulty with emptying her bowel fully. Patient will benefit from skilled PT for improve pelvic floor relaxation and coordination during a bowel movement.    Rehab Potential Excellent   Clinical Impairments Affecting Rehab Potential Cancer precautions   PT Treatment/Interventions Biofeedback;Therapeutic activities;Therapeutic exercise;Neuromuscular re-education;Patient/family education;Scar mobilization;Manual techniques   PT Next Visit Plan pelvic floor EMG   PT Home Exercise Plan progress as needed   Recommended Other Services renewal sent on 09/16/2016   Consulted and Agree with Plan of Care Patient      Patient will benefit from skilled therapeutic intervention in order to improve the following deficits and impairments:  Decreased mobility, Decreased strength, Increased muscle spasms, Increased fascial restricitons, Decreased coordination  Visit Diagnosis: Muscle weakness (generalized) - Plan: PT plan of care cert/re-cert  Other lack of coordination - Plan: PT plan of care cert/re-cert  Other muscle spasm - Plan: PT plan of care cert/re-cert     Problem List Patient Active Problem List   Diagnosis Date Noted  . Insect bite of arm, left 07/23/2014  . Allergic reaction 07/23/2014  . History of colonic polyps 05/30/2014  . Breast cancer of lower-outer quadrant of right female breast ([place]) 09/08/2013  . Unspecified  menopausal and postmenopausal disorder 08/03/2012  . Lichen planus 35/70/1779  . UNSPECIFIED VITAMIN D DEFICIENCY 04/23/2010  . HYPERLIPIDEMIA 11/02/2007  . UNSPECIFIED ANEMIA 11/02/2007  . SICKLE CELL TRAIT 03/30/2007  . HYPERTENSION 03/30/2007  . GERD 03/30/2007  . IBS 03/30/2007  . SLEEP APNEA 03/30/2007  . ABNORMAL ELECTROCARDIOGRAM 03/30/2007  . History of positive PPD, untreated 03/30/2007    Earlie Counts, PT 09/16/16 10:17 AM   [facility] [address] [place], [place], [address] Phone: [phone]   Fax:  [phone]  Name: Cynthia Wagner MRN: [id number] Date of Birth: [birth year]

## 2016-09-24 ENCOUNTER — Encounter: Payer: Self-pay | Admitting: Physical Therapy

## 2016-09-24 ENCOUNTER — Ambulatory Visit: Payer: Medicare Other | Admitting: Physical Therapy

## 2016-09-24 DIAGNOSIS — M6281 Muscle weakness (generalized): Secondary | ICD-10-CM

## 2016-09-24 DIAGNOSIS — R278 Other lack of coordination: Secondary | ICD-10-CM

## 2016-09-24 DIAGNOSIS — M62838 Other muscle spasm: Secondary | ICD-10-CM

## 2016-09-24 NOTE — Therapy (Signed)
[facility] [address] [place], [place], [address] Phone: [phone]   Fax:  [phone]  Physical Therapy Treatment  Patient Details  Name: Cynthia Wagner MRN: [id number] Date of Birth: 1950-02-14 Referring Provider: Dr. Stark Klein  Encounter Date: 09/24/2016      PT End of Session - 09/24/16 1407    Visit Number 7   Number of Visits 10   Date for PT Re-Evaluation 11/20/16   Authorization Type g-code 10th visit; KX modifier is 6 th visit due to uing 9 already   PT Start Time 1400   PT Stop Time 1440   PT Time Calculation (min) 40 min   Activity Tolerance Patient tolerated treatment well   Behavior During Therapy [facility] for tasks assessed/performed      Past Medical History:  Diagnosis Date  . Breast cancer ([place]) 09/2013   right  . Complication of anesthesia    states is sensitive to narcotics  . Dental bridge present    left upper; right upper and lower  . History of radiation therapy 10/27/13- 11/29/13   right breast 4800 cGy 24 sessions  . Hypertension    under control with med., has been on med. since 2006  . IBS (irritable bowel syndrome)   . PONV (postoperative nausea and vomiting)   . Scoliosis   . Sleep apnea    uses CPAP most nights    Past Surgical History:  Procedure Laterality Date  . BREAST LUMPECTOMY WITH NEEDLE LOCALIZATION AND AXILLARY SENTINEL LYMPH NODE BX Right 09/14/2013   Procedure: BREAST LUMPECTOMY WITH NEEDLE LOCALIZATION AND AXILLARY SENTINEL LYMPH NODE BX;  Surgeon: Stark Klein, MD;  Location: [place];  Service: General;  Laterality: Right;  . CARPAL TUNNEL RELEASE Bilateral 1991  . CESAREAN SECTION     x 2  . COLONOSCOPY  2003; 2006  . colonoscopy with polypectomy  12/2013   Dr Collene Mares; due 2021  . ESOPHAGOGASTRODUODENOSCOPY  10/11/2003  . HAMMER TOE SURGERY Left 06/25/2011  . HEMORRHOID SURGERY    . VAGINAL HYSTERECTOMY  1995   ovaries intact, fibroids   . VULVA /PERINEUM BIOPSY      There were no vitals filed for this visit.      Subjective Assessment - 09/24/16 1405    Subjective I am better than when I started. I do my tea so I do not get constipation. If I did not take my tea I may not have a bowel movement every 4 day. I am using my techniques. The dilators are working well. I am on the largest dilator.    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  HTN controlled with meds and diet; IBS; sleep apnea and uses CPAP.  Some back problems and sees a chriopractor.   Patient Stated Goals reduce constipation   Currently in Pain? No/denies                      Pelvic Floor Special Questions - 09/24/16 0001    Pelvic Floor Internal Exam Patient confirms identification and approves PT to assess muscle intergrity   Exam Type Rectal   Palpation tight band has loosened up   Strength fair squeeze, definite lift  with internal and external sphincter           [facility] Adult PT Treatment/Exercise - 09/24/16 0001      Manual Therapy  Manual Therapy Internal Pelvic Floor;Myofascial release;Soft tissue mobilization   Manual therapy comments tapping of the anal sphincter to improve circular contraction   Myofascial Release to abdomen to produce improved peristalic  movement of the intestines to improve bowel movements   Internal Pelvic Floor going through the anal region to perform soft tissue mobs  internal sphincter, puborectalis, distraction of the tissue along the coccyx area in left sidely                  PT Short Term Goals - 09/09/16 1012      PT SHORT TERM GOAL #1   Title understand how to use the anal dilator to stretch the band of tissue   Time 4   Period Weeks   Status Achieved     PT SHORT TERM GOAL #2   Title understand how to perfrom abdominal soft tissue to work on [employer] of the small intestines    Time 4   Period Weeks   Status Achieved           PT Long Term Goals - 09/24/16 1447      PT LONG TERM GOAL #1   Title independent with initial HEP   Time 8   Period Weeks   Status On-going     PT LONG TERM GOAL #2   Title ability to fully empty her bowels due to able to push the bowels out   Baseline sometimes not able   Time 8   Period Weeks   Status On-going     PT LONG TERM GOAL #3   Title ability to have a bowel movement without having to take laxative due to increased pelvic floor strength to 3/5   Time 8   Period Weeks   Status On-going     PT LONG TERM GOAL #4   Title ability to push the therapist finger out of the anal canal due to improved pelvic floor coordination   Time 8   Period Weeks   Status On-going     PT LONG TERM GOAL #5   Title FOTO score </= 51% limitation   Time 8   Period Weeks   Status New               Plan - 09/24/16 1407    Clinical Impression Statement Patient continues to have improve bowel movements.  She is able to contract the internal and external anal sphinter with a lift. patient is progressing toward goals.  Patient has tightness in lower abdominal area. Patient will benefit from skilled therapy to improve pelvic floor relaxation and coordination during a bowel movement.    Rehab Potential Excellent   Clinical Impairments Affecting Rehab Potential Cancer precautions   PT Frequency 1x / week   PT Duration 8 weeks   PT Treatment/Interventions Biofeedback;Therapeutic activities;Therapeutic exercise;Neuromuscular re-education;Patient/family education;Scar mobilization;Manual techniques   PT Next Visit Plan pelvic floor EMG if fixed   PT Home Exercise Plan progress as needed   Recommended Other Services renewal signed by MD   Consulted and Agree with Plan of Care Patient      Patient will benefit from skilled therapeutic intervention in order to improve the following deficits and impairments:  Decreased mobility,  Decreased strength, Increased muscle spasms, Increased fascial restricitons, Decreased coordination  Visit Diagnosis: Muscle weakness (generalized)  Other lack of coordination  Other muscle spasm     Problem List Patient Active Problem List   Diagnosis Date Noted  . Insect bite  of arm, left 07/23/2014  . Allergic reaction 07/23/2014  . History of colonic polyps 05/30/2014  . Breast cancer of lower-outer quadrant of right female breast ([place]) 09/08/2013  . Unspecified menopausal and postmenopausal disorder 08/03/2012  . Lichen planus 18/84/1660  . UNSPECIFIED VITAMIN D DEFICIENCY 04/23/2010  . HYPERLIPIDEMIA 11/02/2007  . UNSPECIFIED ANEMIA 11/02/2007  . SICKLE CELL TRAIT 03/30/2007  . HYPERTENSION 03/30/2007  . GERD 03/30/2007  . IBS 03/30/2007  . SLEEP APNEA 03/30/2007  . ABNORMAL ELECTROCARDIOGRAM 03/30/2007  . History of positive PPD, untreated 03/30/2007    Earlie Counts, PT 09/24/16 2:48 PM   [facility] [address], [place], [place], [address] Phone: [phone]   Fax:  [phone]  Name: KASYN STOUFFER MRN: [id number] Date of Birth: May 19, 1950

## 2016-10-03 ENCOUNTER — Encounter: Payer: Self-pay | Admitting: Physical Therapy

## 2016-10-03 ENCOUNTER — Ambulatory Visit: Payer: Medicare Other | Admitting: Physical Therapy

## 2016-10-03 DIAGNOSIS — M62838 Other muscle spasm: Secondary | ICD-10-CM

## 2016-10-03 DIAGNOSIS — R278 Other lack of coordination: Secondary | ICD-10-CM

## 2016-10-03 DIAGNOSIS — M6281 Muscle weakness (generalized): Secondary | ICD-10-CM

## 2016-10-03 NOTE — Therapy (Signed)
[facility] [address] [place], [place], [address] Phone: [phone]   Fax:  [phone]  Physical Therapy Treatment  Patient Details  Name: Cynthia Wagner MRN: [id number] Date of Birth: [birth year] Referring Provider: Dr. Stark Klein  Encounter Date: 10/03/2016      PT End of Session - 10/03/16 1603    Visit Number 8   Number of Visits 10   Date for PT Re-Evaluation 11/20/16   Authorization Type g-code 10th visit; KX modifier is 6 th visit due to uing 9 already   PT Start Time 1530   PT Stop Time 1610   PT Time Calculation (min) 40 min   Activity Tolerance Patient tolerated treatment well   Behavior During Therapy [facility] for tasks assessed/performed      Past Medical History:  Diagnosis Date  . Breast cancer ([place]) 09/2013   right  . Complication of anesthesia    states is sensitive to narcotics  . Dental bridge present    left upper; right upper and lower  . History of radiation therapy 10/27/13- 11/29/13   right breast 4800 cGy 24 sessions  . Hypertension    under control with med., has been on med. since 2006  . IBS (irritable bowel syndrome)   . PONV (postoperative nausea and vomiting)   . Scoliosis   . Sleep apnea    uses CPAP most nights    Past Surgical History:  Procedure Laterality Date  . BREAST LUMPECTOMY WITH NEEDLE LOCALIZATION AND AXILLARY SENTINEL LYMPH NODE BX Right 09/14/2013   Procedure: BREAST LUMPECTOMY WITH NEEDLE LOCALIZATION AND AXILLARY SENTINEL LYMPH NODE BX;  Surgeon: Stark Klein, MD;  Location: [place];  Service: General;  Laterality: Right;  . CARPAL TUNNEL RELEASE Bilateral 1991  . CESAREAN SECTION     x 2  . COLONOSCOPY  2003; 2006  . colonoscopy with polypectomy  12/2013   Dr Collene Mares; due 2021  . ESOPHAGOGASTRODUODENOSCOPY  10/11/2003  . HAMMER TOE SURGERY Left 06/25/2011  . HEMORRHOID SURGERY    . VAGINAL HYSTERECTOMY  1995   ovaries intact, fibroids   . VULVA /PERINEUM BIOPSY      There were no vitals filed for this visit.      Subjective Assessment - 10/03/16 1538    Subjective I had a colon clense.    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  HTN controlled with meds and diet; IBS; sleep apnea and uses CPAP.  Some back problems and sees a chriopractor.   Patient Stated Goals reduce constipation   Currently in Pain? No/denies                      Pelvic Floor Special Questions - 10/03/16 0001    Biofeedback resting level is 1-2 uv.  Patient takes some time to relax her rectal muscles.  Sidely with contract then relax below 1.5 uv. contract and relax in sitting like you are having a bowel movement.    Biofeedback sensor type Surface  rectal   Biofeedback Activity Bulging;Other  relax pelvic floor like you are having a bowel movement in                    PT Education - 10/03/16 1603    Education provided Yes   Education Details how to relax after a contraction while in sitting   Person(s) Educated Patient  Methods Explanation   Comprehension Verbalized understanding          PT Short Term Goals - 09/09/16 1012      PT SHORT TERM GOAL #1   Title understand how to use the anal dilator to stretch the band of tissue   Time 4   Period Weeks   Status Achieved     PT SHORT TERM GOAL #2   Title understand how to perfrom abdominal soft tissue to work on [employer] of the small intestines   Time 4   Period Weeks   Status Achieved           PT Long Term Goals - 10/03/16 1608      PT LONG TERM GOAL #1   Title independent with initial HEP   Time 8   Period Weeks   Status On-going     PT LONG TERM GOAL #2   Title ability to fully empty her bowels due to able to push the bowels out   Baseline sometimes not able   Period Weeks   Status On-going     PT LONG TERM GOAL #3   Title ability to  have a bowel movement without having to take laxative due to increased pelvic floor strength to 3/5   Time 8   Period Weeks   Status On-going     PT LONG TERM GOAL #4   Title ability to push the therapist finger out of the anal canal due to improved pelvic floor coordination   Time 8   Status On-going               Plan - 10/03/16 1604    Clinical Impression Statement Patient had one colon cleanse and will be having another due to her intestines being so full.  Patient is able to relax her pelvic floor to 2  uv after contracting to simulate her going to the bathroom.  Patient reports her bowels are still pellets.  Patient will benfit from skilled therapy to improve pelvic floor relaxation and coordination during a bowel movement.    Rehab Potential Excellent   Clinical Impairments Affecting Rehab Potential Cancer precautions   PT Frequency 1x / week   PT Duration 8 weeks   PT Treatment/Interventions Biofeedback;Therapeutic activities;Therapeutic exercise;Neuromuscular re-education;Patient/family education;Scar mobilization;Manual techniques   PT Next Visit Plan pelvic floor EMG if fixed   PT Home Exercise Plan progress as needed   Consulted and Agree with Plan of Care Patient      Patient will benefit from skilled therapeutic intervention in order to improve the following deficits and impairments:  Decreased mobility, Decreased strength, Increased muscle spasms, Increased fascial restricitons, Decreased coordination  Visit Diagnosis: Muscle weakness (generalized)  Other lack of coordination  Other muscle spasm     Problem List Patient Active Problem List   Diagnosis Date Noted  . Insect bite of arm, left 07/23/2014  . Allergic reaction 07/23/2014  . History of colonic polyps 05/30/2014  . Breast cancer of lower-outer quadrant of right female breast ([place]) 09/08/2013  . Unspecified menopausal and postmenopausal disorder 08/03/2012  . Lichen planus 62/26/3335  .  UNSPECIFIED VITAMIN D DEFICIENCY 04/23/2010  . HYPERLIPIDEMIA 11/02/2007  . UNSPECIFIED ANEMIA 11/02/2007  . SICKLE CELL TRAIT 03/30/2007  . HYPERTENSION 03/30/2007  . GERD 03/30/2007  . IBS 03/30/2007  . SLEEP APNEA 03/30/2007  . ABNORMAL ELECTROCARDIOGRAM 03/30/2007  . History of positive PPD, untreated 03/30/2007    Earlie Counts, PT 10/03/16 4:11 PM  [facility] [address], [place], [place], [address] Phone: [phone]   Fax:  [phone]  Name: Cynthia Wagner MRN: [id number] Date of Birth: [birth year]

## 2016-10-14 ENCOUNTER — Encounter: Payer: Self-pay | Admitting: Physical Therapy

## 2016-10-14 ENCOUNTER — Ambulatory Visit: Payer: Medicare Other | Attending: General Surgery | Admitting: Physical Therapy

## 2016-10-14 DIAGNOSIS — M6281 Muscle weakness (generalized): Secondary | ICD-10-CM | POA: Insufficient documentation

## 2016-10-14 DIAGNOSIS — M62838 Other muscle spasm: Secondary | ICD-10-CM | POA: Diagnosis present

## 2016-10-14 DIAGNOSIS — R278 Other lack of coordination: Secondary | ICD-10-CM | POA: Insufficient documentation

## 2016-10-14 NOTE — Patient Instructions (Addendum)
Quick Contraction: Gravity Eliminated (Side-Lying)    Lie on left side, hips and knees slightly bent. Quickly squeeze then fully relax pelvic floor. Perform _1__ sets of __5_. Rest for _1__ seconds between sets. Do _3__ times a day.   Copyright  VHI. All rights reserved.    Slow Contraction: Gravity Eliminated (Side-Lying)    Lie on left side, hips and knees slightly bent. Slowly squeeze pelvic floor for _5__ seconds. Rest for _5__ seconds. Repeat _10__ times. Do _2-3__ times a day.   Copyright  VHI. All rights reserved.   after 2 weeks then do in sitting  [facility] [address] [place], [place] [address] Phone # [phone] Fax [phone]

## 2016-10-14 NOTE — Therapy (Signed)
[facility] [address], [place], [place], [address] Phone: [phone]   Fax:  [phone]  Physical Therapy Treatment  Patient Details  Name: Cynthia Wagner MRN: [id number] Date of Birth: October 28, 1950 Referring Provider: Dr. Stark Klein  Encounter Date: 10/14/2016      Cynthia Wagner End of Session - 10/14/16 1528    Visit Number 9   Number of Visits 10   Date for Cynthia Wagner Re-Evaluation 11/20/16   Authorization Type g-code 10th visit; KX modifier is 6 th visit due to uing 9 already   Cynthia Wagner Start Time 1445   Cynthia Wagner Stop Time 1525   Cynthia Wagner Time Calculation (min) 40 min   Activity Tolerance Patient tolerated treatment well   Behavior During Therapy [facility] for tasks assessed/performed      Past Medical History:  Diagnosis Date  . Breast cancer ([place]) 09/2013   right  . Complication of anesthesia    states is sensitive to narcotics  . Dental bridge present    left upper; right upper and lower  . History of radiation therapy 10/27/13- 11/29/13   right breast 4800 cGy 24 sessions  . Hypertension    under control with med., has been on med. since 2006  . IBS (irritable bowel syndrome)   . PONV (postoperative nausea and vomiting)   . Scoliosis   . Sleep apnea    uses CPAP most nights    Past Surgical History:  Procedure Laterality Date  . BREAST LUMPECTOMY WITH NEEDLE LOCALIZATION AND AXILLARY SENTINEL LYMPH NODE BX Right 09/14/2013   Procedure: BREAST LUMPECTOMY WITH NEEDLE LOCALIZATION AND AXILLARY SENTINEL LYMPH NODE BX;  Surgeon: Stark Klein, MD;  Location: [place];  Service: General;  Laterality: Right;  . CARPAL TUNNEL RELEASE Bilateral 1991  . CESAREAN SECTION     x 2  . COLONOSCOPY  2003; 2006  . colonoscopy with polypectomy  12/2013   Dr Collene Mares; due 2021  . ESOPHAGOGASTRODUODENOSCOPY  10/11/2003  . HAMMER TOE SURGERY Left 06/25/2011  . HEMORRHOID SURGERY    . VAGINAL HYSTERECTOMY  1995   ovaries intact, fibroids   . VULVA /PERINEUM BIOPSY      There were no vitals filed for this visit.      Subjective Assessment - 10/14/16 1451    Subjective Things are coming along slowly. I am having another colon cleanse tomorrow. Patient wants to be more on a routine with going to the bathroom and not impacting her life.    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  HTN controlled with meds and diet; IBS; sleep apnea and uses CPAP.  Some back problems and sees a chriopractor.   Patient Stated Goals reduce constipation   Currently in Pain? No/denies                      Pelvic Floor Special Questions - 10/14/16 0001    Pelvic Floor Internal Exam Patient confirms identification and approves Cynthia Wagner to assess muscle intergrity   Exam Type Rectal   Palpation tight band has loosened up   Strength fair squeeze, definite lift           [facility] Adult Cynthia Wagner Treatment/Exercise - 10/14/16 0001      Self-Care   Self-Care Other Self-Care Comments   Other Self-Care Comments  discussed bowel health     Neuro Re-ed    Neuro Re-ed Details  pelvic  floor contraction holding 10 seconds with a lift  10x then pushing finger out      Manual Therapy   Manual Therapy Internal Pelvic Floor;Myofascial release;Soft tissue mobilization   Manual therapy comments tapping of the anal sphincter to improve circular contraction   Internal Pelvic Floor going through the anal region to perform soft tissue mobs  internal sphincter, puborectalis, distraction of the tissue along the coccyx area in left sidely                Cynthia Wagner Education - 10/14/16 1528    Education provided Yes   Education Details contract in sidely with lift of pelvic floor   Person(s) Educated Patient   Methods Explanation;Demonstration;Verbal cues;Handout   Comprehension Verbalized understanding;Returned demonstration          Cynthia Wagner Short Term Goals - 09/09/16  1012      Cynthia Wagner SHORT TERM GOAL #1   Title understand how to use the anal dilator to stretch the band of tissue   Time 4   Period Weeks   Status Achieved     Cynthia Wagner SHORT TERM GOAL #2   Title understand how to perfrom abdominal soft tissue to work on [employer] of the small intestines   Time 4   Period Weeks   Status Achieved           Cynthia Wagner Long Term Goals - 10/14/16 1453      Cynthia Wagner LONG TERM GOAL #2   Title ability to fully empty her bowels due to able to push the bowels out   Baseline sometimes not able   Time 8   Period Weeks   Status On-going     Cynthia Wagner LONG TERM GOAL #3   Title ability to have a bowel movement without having to take laxative due to increased pelvic floor strength to 3/5   Time 8   Period Weeks   Status Deferred     Cynthia Wagner LONG TERM GOAL #4   Title ability to push the therapist finger out of the anal canal due to improved pelvic floor coordination   Time 8   Period Weeks   Status On-going               Plan - 10/14/16 1529    Clinical Impression Statement Patient still needed tactile cues to lift her pelvic floor with contraction.  Patient is still having difficulty with fully emptying her bowels.  Patient strength is 3/5.  Patient does not have to take laxatives.  Patient will benfit from skilled therapy to improve pelvic floor relaxation and coordination during a bowel movment   Rehab Potential Excellent   Cynthia Wagner Frequency 1x / week   Cynthia Wagner Duration 8 weeks   Cynthia Wagner Treatment/Interventions Biofeedback;Therapeutic activities;Therapeutic exercise;Neuromuscular re-education;Patient/family education;Scar mobilization;Manual techniques   Cynthia Wagner Next Visit Plan pelvic floor EMG if fixed with elevator contraction; g-code   Cynthia Wagner Home Exercise Plan progress as needed   Consulted and Agree with Plan of Care Patient      Patient will benefit from skilled therapeutic intervention in order to improve the following deficits and impairments:  Decreased mobility, Decreased  strength, Increased muscle spasms, Increased fascial restricitons, Decreased coordination  Visit Diagnosis: Muscle weakness (generalized)  Other lack of coordination  Other muscle spasm     Problem List Patient Active Problem List   Diagnosis Date Noted  . Insect bite of arm, left 07/23/2014  . Allergic reaction 07/23/2014  . History of colonic polyps 05/30/2014  . Breast cancer  of lower-outer quadrant of right female breast ([place]) 09/08/2013  . Unspecified menopausal and postmenopausal disorder 08/03/2012  . Lichen planus 00/37/0488  . UNSPECIFIED VITAMIN D DEFICIENCY 04/23/2010  . HYPERLIPIDEMIA 11/02/2007  . UNSPECIFIED ANEMIA 11/02/2007  . SICKLE CELL TRAIT 03/30/2007  . HYPERTENSION 03/30/2007  . GERD 03/30/2007  . IBS 03/30/2007  . SLEEP APNEA 03/30/2007  . ABNORMAL ELECTROCARDIOGRAM 03/30/2007  . History of positive PPD, untreated 03/30/2007    Cynthia Wagner, Cynthia Wagner 10/14/16 3:32 PM   [facility] [address] [place], [place], [address] Phone: [phone]   Fax:  [phone]  Name: Cynthia Wagner MRN: [id number] Date of Birth: [birth year]

## 2016-10-25 ENCOUNTER — Ambulatory Visit: Payer: Medicare Other | Admitting: Physical Therapy

## 2016-10-25 ENCOUNTER — Encounter: Payer: Self-pay | Admitting: Physical Therapy

## 2016-10-25 DIAGNOSIS — M62838 Other muscle spasm: Secondary | ICD-10-CM

## 2016-10-25 DIAGNOSIS — M6281 Muscle weakness (generalized): Secondary | ICD-10-CM | POA: Diagnosis not present

## 2016-10-25 DIAGNOSIS — R278 Other lack of coordination: Secondary | ICD-10-CM

## 2016-10-25 NOTE — Patient Instructions (Addendum)
Piriformis Stretch, Supine    Lie supine, one ankle crossed onto opposite knee. Holding bottom leg behind knee, gently pull legs toward chest until stretch is felt in buttock of top leg. Hold __30_ seconds. For deeper stretch gently push top knee away from body.  Repeat _3_ times per session. Do __1_ sessions per day.  Copyright  VHI. All rights reserved.     Happy Baby  1. Position yourself as shown, grabbing onto the feet or behind the knees; you should feel a gentle stretch.  2. Breathe in and allow the pelvic floor muscles to relax.  3. Hold this position for 2-3 minutes or 5 breaths 3x  Squat, Modified: Ankle Support    Place a thick book under heels. Slowly sink into a deep squat. Relax in this position for ___ seconds. Do ___ times a day.  Copyright  VHI. All rights reserved.  Squat, Modified: [employer] on edge of couch. Slide off edge into a deep squat, allowing couch to support back. Relax in this position for ___ seconds. Do ___ times a day.  Copyright  VHI. All rights reserved.  Squat, Modified: Door Support    Hold onto closed and sturdy door handle. Slowly sink into deep squat. Relax in this position for ___ seconds. Do ___ times a day.  Copyright  VHI. All rights reserved.  Squat prior to bowel movement.  Use any of the above positions with roll under feet. Hold 30 seconds with deep breathing.  Toilet meditation on you tube by Olene Floss  When on the commode do deep breathing with breathing in for 5 seconds then out for 5 seconds focusing on your breath.  Palms up and middle finger connects to thumb until you feel relex.    Guide to Using a Anal Dilator ( No radiation) The anal dilator is used to stretch the anal canal from surgery or radiation. Is  a smooth plastic cylinder that is 6 inches long. It is used to stretch the anal canal to assist in having a bowel movement.  1.  Use the dilator your therapist has directed you to .Place  dilator in warm water for 2 min..  2. Lubricate both the anus and tip of the dilator.  Do not use petroleum based lubricant due to increased risk of infection and more difficult to wash off.  3. Lay on your side to insert the tip of the dilator at a right angle to the rectum and lightly insert the dilator.  Exhale as you gently ease the dilator into the anal canal.  Breathe in deeply and inch the dilator deeper. See below for frequency: Anal stenosis- holds dilator in 2-3 minutes 2 times daily for 3-4 months 4. Remove the dilator as you are bearing down to push it out.  Wash your hands and the dilator with warm water and soap. Let the dilator dry completely to prevent bacteria build-up.   Lubrication  Used for intercourse to reduce friction  Avoid ones that have glycerin, warming gels, tingling gels, icing or cooling gel, scented  Avoid parabens due to a preservative similar to female sex hormone  May need to be reapplied once or several times during sexual activity  Can be applied to both partners genitals prior to vaginal penetration to minimize friction or irritation  Prevent irritation and mucosal tears that cause post coital pain and increased the risk of vaginal and urinary tract infections  Oil-based lubricants cannot be used with condoms  due to breaking them down.  Least likely to irritate vaginal tissue.   Plant based-lubes are safe  Silicone-based lubrication are thicker and last long and used for post-menopausal women  Use coconut oil on the anal region daily to keep the skin pliable.   Vaginal Lubricators Here is a list of some suggested lubricators you can use for intercourse. Use the most hypoallergenic product.  You can place on you or your partner.   Astroglide natural in green box  K-Y Intrique- silicone base  Good Clean Love -[employer], [employer], [employer] (water based)  Slippery Stuff  Sylk, Sliquid Natural H2O ( good  if frequent UTI's)  Blossom Organics  (www.blossom-organics.com)  Luvena, Coconut oil  PJur Woman Nude- water based lubricant, [place], good for cancer patients with radiation  Yes lubricant- [employer] Platinum-Silicone, [employer], [employer] Things to avoid in lubricants are glycerin, warming gels, tingling gels, icing or cooling  gels, and scented gels.  Also avoid Vaseline. KY jelly, Replens, and Astroglide kills good bacteria(lactobacilli)  Things to avoid in the vaginal area  Do not use things to irritate the vulvar area  No lotions  No soaps; can use Aveeno or Calendula cleanser if needed. Must be gentle  No deodorants  No douches  Good to sleep without underwear to let the vaginal area to air out  No scrubbing: spread the lips to let warm water rinse over labias and pat dry   Bear Down    Tighten the pelvic floor then Exhaling, bear down as if to have a bowel movement. Repeat _5__ times. Do __2-3_ times a day.  Knee-to-Chest Stretch: Unilateral    With hand behind right knee, pull knee in to chest until a comfortable stretch is felt in lower back and buttocks. Keep back relaxed. Hold _30___ seconds. Repeat __2__ times per set. Do __1__ sets per session. Do __1__ sessions per day.  http://orth.exer.us/126   Copyright  VHI. All rights reserved.  Piriformis Stretch, Supine    Lie supine, legs bent, feet flat. Raise one bent leg and, grasping ankle with both hands, pull leg toward opposite shoulder. Hold _30__ seconds.  Repeat _2__ times per session. Do _1__ sessions per day. Perform with other leg straight.  Copyright  VHI. All rights reserved.  Toileting Techniques for Bowel Movements (Defecation)  Using your belly (abdomen) and pelvic floor muscles to have a bowel movement is usually instinctive.  Sometimes people can have problems with these muscles and have to relearn proper defecation (emptying) techniques.  If you have weakness in  your muscles, organs that are falling out, decreased sensation in your pelvis, or ignore your urge to go, you may find yourself straining to have a bowel movement.  You are straining if you are:   holding your breath or taking in a huge gulp of air and holding it   keeping your lips and jaw tensed and closed tightly  turning red in the face because of excessive pushing or forcing  developing or worsening your  hemorrhoids  getting faint while pushing  not emptying completely and have to defecate many times a day  If you are straining, you are actually making it harder for yourself to have a bowel movement.  Many people find they are pulling up with the pelvic floor muscles and closing off instead of opening the anus. Due to lack pelvic floor relaxation and coordination the abdominal muscles, one has to work harder to push  the feces out.  Many people have never been taught how to defecate efficiently and effectively.  Notice what happens to your body when you are having a bowel movement.  While you are sitting on the toilet pay attention to the following areas:  Jaw and mouth position  Angle of your hips    Whether your feet touch the ground or not  Arm placement   Spine position  Waist  Belly tension  Anus (opening of the anal canal)  An Evacuation/Defecation Plan   Here are the 4 basic points:  1. Lean forward enough for your elbows to rest on your knees 2. Support your feet on the floor or use a low stool if your feet don't touch the floor  3. Push out your belly as if you have swallowed a beach ball-you should feel a widening of your waist 4. Open and relax your pelvic floor muscles, rather than tightening around the anus      The following conditions my require modifications to your toileting posture:   If you have had surgery in the past that limits your back, hip, pelvic, knee or ankle flexibility  Constipation   Your healthcare practitioner may  make the following additional suggestions and adjustments:  1. Sit on the toilet  a)   Make sure your feet are supported. b)   Notice your hip angle and spine position-most people find it effective to lean forward or raise their knees, which can help the muscles around the anus to relax  c)   When you lean forward, place your forearms on your thighs for support  2. Relax suggestions a)   Breath deeply in through your nose and out slowly through your mouth as if you are smelling the flowers and blowing out the candles. b)   To become aware of how to relax your muscles, contracting and releasing muscles can be helpful.  Pull your pelvic floor muscles in tightly by using the image of holding back gas, or closing around the anus (visualize making a circle smaller) and lifting the anus up and in.  Then release the muscles and your anus should drop down and feel open. Repeat 5 times ending with the feeling of relaxation. c)   Keep your pelvic floor muscles relaxed; let your belly bulge out. d)   The digestive tract starts at the mouth and ends at the anal opening, so be sure torelax both ends of the tube.  Place your tongue on the roof of your mouth withyour teeth separated.  This helps relax your mouth and will help to relax the anus at the same time.  3. Empty (defecation) a)   Keep your pelvic floor and sphincter relaxed, then bulge your anal muscles.  Make the anal opening wide.  b)   Stick your belly out as if you have swallowed a beach ball. c)   Make your belly wall hard using your belly muscles while continuing to breathe. Doing this makes it easier to open your anus. d)   Breath out and give a grunt (or try using other sounds such as ahhhh, shhhhh, ohhhh or grrrrrrr).  4)   Finish a)   As you finish your bowel movement, pull the pelvic floor muscles up and in.  This will leave your anus in the proper place rather than remaining pushed out and down. If you leave your anus pushed out and  down, it will start to feel as though that is normal and give  you incorrect signals about needing to have a bowel movement.  Quick Contraction: Gravity Resisted (Sitting)    Sitting, quickly squeeze then fully relax pelvic floor. Perform __1_ sets of _10__. Rest for _1__ seconds between sets. Do _2__ times a day. Can do on side, standing, on hands and knees.  Copyright  VHI. All rights reserved.    Slow Contraction: Gravity Resisted (Sitting)    Sitting, slowly squeeze pelvic floor for _10__ seconds. Rest for _5__ seconds. Repeat _5-10__ times. Do _2__ times a day. Can do in standing, hands and knees, going up steps.  Copyright  VHI. All rights reserved.  Elevator (Sitting)    Sitting, imagine pelvic floor with 2 levels. "Going up", contract pelvic floor and Hold for _2__ seconds on each level. Then "going down", contract pelvic floor and Hold for _2__ seconds on each level. Relax. Repeat _1__ times. Do __2_ times a day. Do in any position.  Copyright  VHI. All rights reserved.  Earlie Counts, PT @TODAY @ 9:05 AM

## 2016-10-25 NOTE — Therapy (Signed)
[facility] [address], [place], [place], [address] Phone: [phone]   Fax:  [phone]  Physical Therapy Treatment  Patient Details  Name: Cynthia Wagner MRN: [id number] Date of Birth: 1951/01/30 Referring Provider: Dr. Stark Klein  Encounter Date: 10/25/2016      PT End of Session - 10/25/16 0923    Visit Number 10   Date for PT Re-Evaluation 11/20/16   Authorization Type g-code 10th visit; KX modifier is 6 th visit due to uing 9 already   PT Start Time 0845   PT Stop Time 0923   PT Time Calculation (min) 38 min   Activity Tolerance Patient tolerated treatment well   Behavior During Therapy [facility] for tasks assessed/performed      Past Medical History:  Diagnosis Date  . Breast cancer ([place]) 09/2013   right  . Complication of anesthesia    states is sensitive to narcotics  . Dental bridge present    left upper; right upper and lower  . History of radiation therapy 10/27/13- 11/29/13   right breast 4800 cGy 24 sessions  . Hypertension    under control with med., has been on med. since 2006  . IBS (irritable bowel syndrome)   . PONV (postoperative nausea and vomiting)   . Scoliosis   . Sleep apnea    uses CPAP most nights    Past Surgical History:  Procedure Laterality Date  . BREAST LUMPECTOMY WITH NEEDLE LOCALIZATION AND AXILLARY SENTINEL LYMPH NODE BX Right 09/14/2013   Procedure: BREAST LUMPECTOMY WITH NEEDLE LOCALIZATION AND AXILLARY SENTINEL LYMPH NODE BX;  Surgeon: Stark Klein, MD;  Location: [place];  Service: General;  Laterality: Right;  . CARPAL TUNNEL RELEASE Bilateral 1991  . CESAREAN SECTION     x 2  . COLONOSCOPY  2003; 2006  . colonoscopy with polypectomy  12/2013   Dr Collene Mares; due 2021  . ESOPHAGOGASTRODUODENOSCOPY  10/11/2003  . HAMMER TOE SURGERY Left 06/25/2011  . HEMORRHOID SURGERY    . VAGINAL HYSTERECTOMY  1995   ovaries intact, fibroids  . VULVA /PERINEUM  BIOPSY      There were no vitals filed for this visit.      Subjective Assessment - 10/25/16 0846    Subjective Things are doing well. I am dilating the anus. Sometimes I feel I am not fully emptying but now I have tools.    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  HTN controlled with meds and diet; IBS; sleep apnea and uses CPAP.  Some back problems and sees a chriopractor.   Patient Stated Goals reduce constipation   Currently in Pain? No/denies            [facility] PT Assessment - 10/25/16 0001      Assessment   Medical Diagnosis C50.11 [place] neoplasm of lower - outer quadrant of right female breast   Referring Provider Dr. Stark Klein   Onset Date/Surgical Date 02/05/16   Prior Therapy None     Precautions   Precautions Other (comment)   Precaution Comments Cancer     Restrictions   Weight Bearing Restrictions No     Balance Screen   Has the patient fallen in the past 6 months No   Has the patient had a decrease in activity level because of a fear of falling?  No   Is the patient reluctant to leave their home because  of a fear of falling?  No     Home Ecologist residence     Prior Function   Level of Independence Independent   Vocation Retired   Leisure exercise     Cognition   Overall Cognitive Status Within Functional Limits for tasks assessed     Observation/Other Assessments   Focus on Therapeutic Outcomes (FOTO)  48% limitation bowel      Posture/Postural Control   Posture/Postural Control Postural limitations   Posture Comments left ilium is higher, scoliosis     AROM   Lumbar - Right Side Bend decreased by 25%     Strength   Right Hip ABduction 5/5   Left Hip Extension 5/5   Left Hip ABduction 5/5                  Pelvic Floor Special Questions - 10/25/16 0001    Palpation tight band has loosened up   Strength  fair squeeze, definite lift                   PT Education - 10/25/16 0922    Education provided Yes   Education Details pelvic floor contraction in sitting; reviewed past HEP and patient understands   Person(s) Educated Patient   Methods Explanation;Demonstration;Handout   Comprehension Verbalized understanding;Returned demonstration          PT Short Term Goals - 09/09/16 1012      PT SHORT TERM GOAL #1   Title understand how to use the anal dilator to stretch the band of tissue   Time 4   Period Weeks   Status Achieved     PT SHORT TERM GOAL #2   Title understand how to perfrom abdominal soft tissue to work on [employer] of the small intestines   Time 4   Period Weeks   Status Achieved           PT Long Term Goals - 10/25/16 4315      PT LONG TERM GOAL #1   Title independent with initial HEP   Time 8   Period Weeks   Status Achieved     PT LONG TERM GOAL #2   Title ability to fully empty her bowels due to able to push the bowels out   Time 8   Period Weeks   Status Achieved     PT LONG TERM GOAL #3   Title ability to have a bowel movement without having to take laxative due to increased pelvic floor strength to 3/5   Time 8   Period Weeks   Status Achieved     PT LONG TERM GOAL #4   Title ability to push the therapist finger out of the anal canal due to improved pelvic floor coordination   Time 8   Period Weeks   Status Achieved     PT LONG TERM GOAL #5   Title FOTO score </= 51% limitation   Time 8   Period Weeks   Status Achieved               Plan - 10/25/16 4008    Clinical Impression Statement Patient has full hip strength.  Patient pelvic floor strength is 3/5 and able to push the therapist finger out.  Patient has the tools to have a full bowel movement.  Patient has had 4 colon cleanse that has also helped her with bowel movements.  Patient is independent with her HEP.  Patient is ready for discharge.    Rehab  Potential Excellent   Clinical Impairments Affecting Rehab Potential Cancer precautions   PT Treatment/Interventions Biofeedback;Therapeutic activities;Therapeutic exercise;Neuromuscular re-education;Patient/family education;Scar mobilization;Manual techniques   PT Next Visit Plan Discharge to current HEP this visit   PT Home Exercise Plan Current HEP   Consulted and Agree with Plan of Care Patient      Patient will benefit from skilled therapeutic intervention in order to improve the following deficits and impairments:  Decreased mobility, Decreased strength, Increased muscle spasms, Increased fascial restricitons, Decreased coordination  Visit Diagnosis: Muscle weakness (generalized)  Other lack of coordination  Other muscle spasm       G-Codes - 11-14-16 0926    Functional Assessment Tool Used (Outpatient Only) FOTO score is 48% limitation   Functional Limitation Other PT primary   Other PT Primary Goal Status (M4680) At least 40 percent but less than 60 percent impaired, limited or restricted   Other PT Primary Discharge Status (H2122) At least 40 percent but less than 60 percent impaired, limited or restricted      Problem List Patient Active Problem List   Diagnosis Date Noted  . Insect bite of arm, left 07/23/2014  . Allergic reaction 07/23/2014  . History of colonic polyps 05/30/2014  . Breast cancer of lower-outer quadrant of right female breast ([place]) 09/08/2013  . Unspecified menopausal and postmenopausal disorder 08/03/2012  . Lichen planus 48/25/0037  . UNSPECIFIED VITAMIN D DEFICIENCY 04/23/2010  . HYPERLIPIDEMIA 11/02/2007  . UNSPECIFIED ANEMIA 11/02/2007  . SICKLE CELL TRAIT 03/30/2007  . HYPERTENSION 03/30/2007  . GERD 03/30/2007  . IBS 03/30/2007  . SLEEP APNEA 03/30/2007  . ABNORMAL ELECTROCARDIOGRAM 03/30/2007  . History of positive PPD, untreated 03/30/2007    Earlie Counts, PT Nov 14, 2016 9:28 AM   [facility] [address] [place], [place], [address] Phone: [phone]   Fax:  [phone]  Name: Cynthia Wagner MRN: [id number] Date of Birth: 1950/09/09  PHYSICAL THERAPY DISCHARGE SUMMARY  Visits from Start of Care: 10  Current functional level related to goals / functional outcomes: See above   Remaining deficits: See above   Education / Equipment: HEP Plan: Patient agrees to discharge.  Patient goals were met. Patient is being discharged due to meeting the stated rehab goals. Thank you for the referral. Earlie Counts, PT 11/14/2016 9:28 AM   ?????

## 2017-11-26 ENCOUNTER — Ambulatory Visit (INDEPENDENT_AMBULATORY_CARE_PROVIDER_SITE_OTHER): Payer: Medicare Other | Admitting: Ophthalmology

## 2017-11-26 ENCOUNTER — Encounter (INDEPENDENT_AMBULATORY_CARE_PROVIDER_SITE_OTHER): Payer: Self-pay | Admitting: Ophthalmology

## 2017-11-26 DIAGNOSIS — H35033 Hypertensive retinopathy, bilateral: Secondary | ICD-10-CM | POA: Diagnosis not present

## 2017-11-26 DIAGNOSIS — H43813 Vitreous degeneration, bilateral: Secondary | ICD-10-CM

## 2017-11-26 DIAGNOSIS — I10 Essential (primary) hypertension: Secondary | ICD-10-CM | POA: Diagnosis not present

## 2017-11-26 DIAGNOSIS — H3581 Retinal edema: Secondary | ICD-10-CM

## 2017-11-26 DIAGNOSIS — Z961 Presence of intraocular lens: Secondary | ICD-10-CM

## 2017-11-26 NOTE — Progress Notes (Addendum)
[facility] Note  11/26/2017     CHIEF COMPLAINT Patient presents for Retina Evaluation   HISTORY OF PRESENT ILLNESS: Cynthia Wagner is a 67 y.o. female who presents to the clinic today for:   HPI    Retina Evaluation    In both eyes.  This started 1 hour ago.  Associated Symptoms Floaters.  Negative for Distortion, Redness, Trauma, Shoulder/Hip pain, Fatigue, Weight Loss, Jaw Claudication, Glare, Pain, Flashes, Blind Spot, Photophobia, Scalp Tenderness and Fever.  Context:  distance vision, mid-range vision and near vision.  Treatments tried include eye drops and surgery.  Response to treatment was significant improvement.  I, the attending physician,  performed the HPI with the patient and updated documentation appropriately.          Comments    Referral of Dr. Bing Plume for retina eval. Patient states she had yag laser OD last Wednesday  (11/19/17) ,she was to have yag OS yesterday, while waiting to have yag she noticed "thread string" floating down (unsure of eye ), Dr.Digby advised for her to have Retina eval before proceeding with procedure. Pt states she has not noticed any before and it happened just that one time.Denies flashes, glare and ocular pain. Pt states she does have dry eyes and she uses Systane and Refresh gtt's PRN. Pt reports she had cataract sx OU 2017.       Last edited by Bernarda Caffey, MD on 11/26/2017  9:52 AM. (History)    pt states she saw floaters yesterday when she was at a dr appoinment, pt was unable to tell which eye floaters came from, pt states she has never seen them before in her life, pt states she had a YAG OD procedure done last Wednesday at Dr. Rachael Fee office and was told she may have floaters afterwards, pt states her appointment with Digby yesterday was to have YAG OS procedure done and she mentioned the floater to him, pt states the floater has since resolved, pt states Dr. Bing Plume rescheduled YAG for December, pt  denies seeing any flashes, pt endorses having HTN  Referring physician: Calvert Cantor, MD [address] [place], [place] [address]  HISTORICAL INFORMATION:   Selected notes from the MEDICAL RECORD NUMBER Referred by Dr. Calvert Cantor for concern of floaters OD LEE: 10.22.19 (D. Digby) [BCVA: OD: OS:] Ocular Hx-DES PMH-breast cancer    CURRENT MEDICATIONS: Current Outpatient Medications (Ophthalmic Drugs)  Medication Sig  . cycloSPORINE (RESTASIS) 0.05 % ophthalmic emulsion 1 drop 2 (two) times daily.     No current facility-administered medications for this visit.  (Ophthalmic Drugs)   Current Outpatient Medications (Other)  Medication Sig  . Cholecalciferol (VITAMIN D) 2000 units tablet Take 2,000 Units by mouth daily.  . fluconazole (DIFLUCAN) 150 MG tablet   . Linaclotide (LINZESS) 290 MCG CAPS Take by mouth daily.  Marland Kitchen losartan-hydrochlorothiazide (HYZAAR) 50-12.5 MG per tablet TAKE 1 TABLET BY MOUTH EVERY DAY  . Probiotic Product (PROBIOTIC PO) Take by mouth daily.    Marland Kitchen amLODipine (NORVASC) 5 MG tablet Take 1 tablet (5 mg total) by mouth daily. (Patient not taking: Reported on 11/26/2017)  . aspirin 81 MG tablet Take 81 mg by mouth as needed. 2 by mouth at bedtime  . BRISDELLE 7.5 MG CAPS   . Calcium Carbonate-Vitamin D (CALCIUM + D PO) Take by mouth. 2 by mouth daily  . clobetasol ointment (TEMOVATE) 0.05 % See admin instructions.  . hydrocortisone valerate cream (WESTCORT)  0.2 % Apply 1 application topically as needed.  . metoprolol (LOPRESSOR) 50 MG tablet Take 1 tablet (50 mg total) by mouth as directed. (Patient not taking: Reported on 11/26/2017)  . Multiple Vitamin (MULTIVITAMIN) tablet Take 1 tablet by mouth daily.    . Omega-3 Fatty Acids (FISH OIL) 1000 MG CAPS Take 1,000 mg by mouth daily.    . polyethylene glycol (MIRALAX / GLYCOLAX) packet Take 17 g by mouth as needed.   No current facility-administered medications for this visit.  (Other)      REVIEW  OF SYSTEMS: ROS    Positive for: Eyes   Negative for: Constitutional, Gastrointestinal, Neurological, Skin, Genitourinary, Musculoskeletal, HENT, Endocrine, Cardiovascular, Respiratory, Psychiatric, Allergic/Imm, Heme/Lymph   Last edited by Zenovia Jordan, LPN on 67/34/1937  9:02 AM. (History)       ALLERGIES No Known Allergies  PAST MEDICAL HISTORY Past Medical History:  Diagnosis Date  . Breast cancer ([place]) 09/2013   right  . Complication of anesthesia    states is sensitive to narcotics  . Dental bridge present    left upper; right upper and lower  . History of radiation therapy 10/27/13- 11/29/13   right breast 4800 cGy 24 sessions  . Hypertension    under control with med., has been on med. since 2006  . IBS (irritable bowel syndrome)   . PONV (postoperative nausea and vomiting)   . Scoliosis   . Sleep apnea    uses CPAP most nights   Past Surgical History:  Procedure Laterality Date  . BREAST LUMPECTOMY WITH NEEDLE LOCALIZATION AND AXILLARY SENTINEL LYMPH NODE BX Right 09/14/2013   Procedure: BREAST LUMPECTOMY WITH NEEDLE LOCALIZATION AND AXILLARY SENTINEL LYMPH NODE BX;  Surgeon: Stark Klein, MD;  Location: [facility];  Service: General;  Laterality: Right;  . CARPAL TUNNEL RELEASE Bilateral 1991  . CATARACT EXTRACTION    . CESAREAN SECTION     x 2  . COLONOSCOPY  2003; 2006  . colonoscopy with polypectomy  12/2013   Dr Collene Mares; due 2021  . ESOPHAGOGASTRODUODENOSCOPY  10/11/2003  . EYE SURGERY    . HAMMER TOE SURGERY Left 06/25/2011  . HEMORRHOID SURGERY    . VAGINAL HYSTERECTOMY  1995   ovaries intact, fibroids  . VULVA /PERINEUM BIOPSY    . YAG LASER APPLICATION      FAMILY HISTORY Family History  Problem Relation Age of Onset  . Heart attack Father 17  . Stroke Mother 38  . Breast cancer Sister   . Pulmonary embolism Sister        DVT in context breast cancer  . Hypertension Brother   . Leukemia Brother   . Tuberculosis Maternal Uncle    . Diabetes Maternal Aunt   . Other Sister        cerebral amyloid angiopathy  . Pulmonary embolism Daughter        on BCP; non smoker    SOCIAL HISTORY Social History   Tobacco Use  . Smoking status: Never Smoker  . Smokeless tobacco: Never Used  Substance Use Topics  . Alcohol use: Yes    Comment: 2 glasses wine/week  . Drug use: No         OPHTHALMIC EXAM:  Base Eye Exam    Visual Acuity (Snellen - Linear)      Right Left   Dist cc 20/25 +2 20/30 +2   Dist ph cc 20/20 -2 20/25   Correction:  Glasses  Tonometry (Tonopen, 8:26 AM)      Right Left   Pressure 14 14       Pupils      Dark Light Shape React APD   Right 4 3 Round Brisk None   Left 4 3 Round Brisk None       Visual Fields (Counting fingers)      Left Right    Full Full       Extraocular Movement      Right Left    Full, Ortho Full, Ortho       Neuro/Psych    Oriented x3:  Yes   Mood/Affect:  Normal       Dilation    Both eyes:  1.0% Mydriacyl, 2.5% Phenylephrine @ 8:27 AM        Slit Lamp and Fundus Exam    Slit Lamp Exam      Right Left   Lids/Lashes Normal Normal   Conjunctiva/Sclera Mild Conjunctivochalasis, mild Melanosis,  Mild Conjunctivochalasis, mild Melanosis   Cornea well healed temp cat would well healed temp cat would, mild Arcus   Anterior Chamber Deep and quiet Deep and quiet   Iris Round and well dilated Round and well dilated   Lens Posterior chamber intraocular lens in excellent position, open PC Posterior chamber intraocular lens in excellent position, , 1-2+ Posterior capsular opacification   Vitreous mild Vitreous syneresis, Posterior vitreous detachment, scattered vitreous condensations mild Vitreous syneresis, scattered vitreous condensations       Fundus Exam      Right Left   Disc Pink and Sharp Pink and Sharp   C/D Ratio 0.55 0.4   Macula Flat, Good foveal reflex, mild Retinal pigment epithelial mottling, No heme or edema Flat, Good foveal  reflex, mild  Retinal pigment epithelial mottling, No heme or edema   Vessels mild Vascular attenuation, mild AV crossing changes mild Vascular attenuation, mild  AV crossing changes   Periphery Attached; no RT/RD on scleral depressed exam Attached; no RT/RD on scleral depressed exam        Refraction    Wearing Rx      Sphere Cylinder Axis   Right +0.75 +0.75 140   Left +1.25 Sphere    Age:  28yr   Type:  PAL       Manifest Refraction      Sphere Cylinder Axis Dist VA   Right [place] +1.00 138 20/15-2   Left +0.75 Sphere  20/20          IMAGING AND PROCEDURES  Imaging and Procedures for @TODAY @  OCT, Retina - OU - Both Eyes       Right Eye Quality was good. Central Foveal Thickness: 316. Progression has no prior data. Findings include normal foveal contour, no SRF, no IRF.   Left Eye Quality was good. Central Foveal Thickness: 298. Progression has no prior data. Findings include normal foveal contour, no SRF, no IRF.   Notes *Images captured and stored on drive  Diagnosis / Impression:  NFP, no IRF/SRF OU   Clinical management:  See below  Abbreviations: NFP - Normal foveal profile. CME - cystoid macular edema. PED - pigment epithelial detachment. IRF - intraretinal fluid. SRF - subretinal fluid. EZ - ellipsoid zone. ERM - epiretinal membrane. ORA - outer retinal atrophy. ORT - outer retinal tubulation. SRHM - subretinal hyper-reflective material                 ASSESSMENT/PLAN:    ICD-10-CM   1.  Posterior vitreous detachment of both eyes H43.813   2. Essential hypertension I10   3. Hypertensive retinopathy of both eyes H35.033   4. Retinal edema H35.81 OCT, Retina - OU - Both Eyes  5. Pseudophakia of both eyes Z96.1     1. PVD / vitreous syneresis OU  Had single, isolated episode of floaters -- last seconds and self resolved; no flashes  Discussed findings and prognosis  No RT or RD on 360 scleral depressed exam  Reviewed s/s of RT/RD  Strict  return precautions for any such RT/RD signs/symptoms  Cleared from a retina perspective to undergo YAG capsulotomy OS  F/u here prn  2,3. Hypertensive retinopathy OU - discussed importance of tight BP control - monitor  4. No retinal edema on exam or OCT  5. Pseudophakia OU  - s/p CE/IOL OU  - s/p YAG cap OD  - beautiful surgeries, doing well  - monitor    Ophthalmic Meds Ordered this visit:  No orders of the defined types were placed in this encounter.      Return if symptoms worsen or fail to improve.  There are no Patient Instructions on file for this visit.   Explained the diagnoses, plan, and follow up with the patient and they expressed understanding.  Patient expressed understanding of the importance of proper follow up care.   This document serves as a record of services personally performed by Gardiner Sleeper, MD, PhD. It was created on their behalf by Ernest Mallick, OA, an ophthalmic assistant. The creation of this record is the provider's dictation and/or activities during the visit.    Electronically signed by: Ernest Mallick, OA  10.23.19 12:34 AM    Gardiner Sleeper, M.D., Ph.D. Diseases & Surgery of the Retina and Vitreous [facility]   I have reviewed the above documentation for accuracy and completeness, and I agree with the above. Gardiner Sleeper, M.D., Ph.D. 11/28/17 12:34 AM    Abbreviations: M myopia (nearsighted); A astigmatism; H hyperopia (farsighted); P presbyopia; Mrx spectacle prescription;  CTL contact lenses; OD right eye; OS left eye; OU both eyes  XT exotropia; ET esotropia; PEK punctate epithelial keratitis; PEE punctate epithelial erosions; DES dry eye syndrome; MGD meibomian gland dysfunction; ATs artificial tears; PFAT's preservative free artificial tears; [place] nuclear sclerotic cataract; PSC posterior subcapsular cataract; ERM epi-retinal membrane; PVD posterior vitreous detachment; RD retinal detachment; DM diabetes  mellitus; DR diabetic retinopathy; NPDR non-proliferative diabetic retinopathy; PDR proliferative diabetic retinopathy; CSME clinically significant macular edema; DME diabetic macular edema; dbh dot blot hemorrhages; CWS cotton wool spot; POAG primary open angle glaucoma; C/D cup-to-disc ratio; HVF humphrey visual field; GVF goldmann visual field; OCT optical coherence tomography; IOP intraocular pressure; BRVO Branch retinal vein occlusion; CRVO central retinal vein occlusion; CRAO central retinal artery occlusion; BRAO branch retinal artery occlusion; RT retinal tear; SB scleral buckle; PPV pars plana vitrectomy; VH Vitreous hemorrhage; PRP panretinal laser photocoagulation; IVK intravitreal kenalog; VMT vitreomacular traction; MH Macular hole;  NVD neovascularization of the disc; NVE neovascularization elsewhere; AREDS age related eye disease study; ARMD age related macular degeneration; POAG primary open angle glaucoma; EBMD epithelial/anterior basement membrane dystrophy; ACIOL anterior chamber intraocular lens; IOL intraocular lens; PCIOL posterior chamber intraocular lens; Phaco/IOL phacoemulsification with intraocular lens placement; [place] photorefractive keratectomy; LASIK laser assisted in situ keratomileusis; HTN hypertension; DM diabetes mellitus; COPD chronic obstructive pulmonary disease

## 2017-11-28 ENCOUNTER — Encounter (INDEPENDENT_AMBULATORY_CARE_PROVIDER_SITE_OTHER): Payer: Self-pay | Admitting: Ophthalmology

## 2019-10-14 ENCOUNTER — Other Ambulatory Visit: Payer: Self-pay | Admitting: Gastroenterology

## 2019-10-14 DIAGNOSIS — R140 Abdominal distension (gaseous): Secondary | ICD-10-CM

## 2019-10-14 DIAGNOSIS — R634 Abnormal weight loss: Secondary | ICD-10-CM

## 2019-10-20 ENCOUNTER — Other Ambulatory Visit: Payer: Medicare Other

## 2019-10-26 ENCOUNTER — Inpatient Hospital Stay: Admission: RE | Admit: 2019-10-26 | Payer: Medicare Other | Source: Ambulatory Visit

## 2020-05-02 ENCOUNTER — Other Ambulatory Visit: Payer: Self-pay | Admitting: General Surgery

## 2020-05-02 DIAGNOSIS — R140 Abdominal distension (gaseous): Secondary | ICD-10-CM

## 2020-05-16 ENCOUNTER — Other Ambulatory Visit: Payer: Self-pay | Admitting: General Surgery

## 2020-05-16 ENCOUNTER — Ambulatory Visit
Admission: RE | Admit: 2020-05-16 | Discharge: 2020-05-16 | Disposition: A | Discharge status: Home / Self Care | Payer: Medicare Other | Source: Ambulatory Visit | Attending: General Surgery | Admitting: General Surgery

## 2020-05-16 DIAGNOSIS — M25811 Other specified joint disorders, right shoulder: Secondary | ICD-10-CM

## 2020-05-16 DIAGNOSIS — R140 Abdominal distension (gaseous): Secondary | ICD-10-CM

## 2020-05-16 MED ORDER — IOPAMIDOL (ISOVUE-300) INJECTION 61%
100.0000 mL | Freq: Once | INTRAVENOUS | Status: AC | PRN
  Administered 2020-05-16: 100 mL via INTRAVENOUS

## 2021-06-26 ENCOUNTER — Ambulatory Visit: Payer: Medicare Other | Attending: General Surgery | Admitting: Physical Therapy

## 2021-06-26 DIAGNOSIS — R2231 Localized swelling, mass and lump, right upper limb: Secondary | ICD-10-CM | POA: Insufficient documentation

## 2021-06-26 NOTE — Patient Instructions (Signed)
[email or web address] Lymphatic flow series with Shoosh Lettick Crotzer        [email or web address] Resources Self care videos MLD for Upper extremity

## 2021-06-26 NOTE — Therapy (Signed)
OUTPATIENT PHYSICAL THERAPY ONCOLOGY EVALUATION  Patient Name: Cynthia Wagner MRN: [id number] DOB:03-11-1950, 71 y.o., female Today's Date: 06/26/2021    Past Medical History:  Diagnosis Date   Breast cancer ([place]) 02/3141   right   Complication of anesthesia    states is sensitive to narcotics   Dental bridge present    left upper; right upper and lower   History of radiation therapy 10/27/13- 11/29/13   right breast 4800 cGy 24 sessions   Hypertension    under control with med., has been on med. since 2006   IBS (irritable bowel syndrome)    PONV (postoperative nausea and vomiting)    Scoliosis    Sleep apnea    uses CPAP most nights   Past Surgical History:  Procedure Laterality Date   BREAST LUMPECTOMY WITH NEEDLE LOCALIZATION AND AXILLARY SENTINEL LYMPH NODE BX Right 09/14/2013   Procedure: BREAST LUMPECTOMY WITH NEEDLE LOCALIZATION AND AXILLARY SENTINEL LYMPH NODE BX;  Surgeon: Stark Klein, MD;  Location: [facility];  Service: General;  Laterality: Right;   CARPAL TUNNEL RELEASE Bilateral 1991   CATARACT EXTRACTION     CESAREAN SECTION     x 2   COLONOSCOPY  2003; 2006   colonoscopy with polypectomy  12/2013   Dr Collene Mares; due 2021   ESOPHAGOGASTRODUODENOSCOPY  10/11/2003   EYE SURGERY     HAMMER TOE SURGERY Left 06/25/2011   HEMORRHOID SURGERY     VAGINAL HYSTERECTOMY  1995   ovaries intact, fibroids   VULVA /PERINEUM BIOPSY     YAG LASER APPLICATION     Patient Active Problem List   Diagnosis Date Noted   Insect bite of arm, left 07/23/2014   Allergic reaction 07/23/2014   History of colonic polyps 05/30/2014   Breast cancer of lower-outer quadrant of right female breast ([place]) 09/08/2013   Unspecified menopausal and postmenopausal disorder 88/87/5797   Lichen planus 28/20/6015   UNSPECIFIED VITAMIN D DEFICIENCY 04/23/2010   HYPERLIPIDEMIA 11/02/2007   UNSPECIFIED ANEMIA 11/02/2007   SICKLE CELL TRAIT 03/30/2007   HYPERTENSION 03/30/2007   GERD  03/30/2007   IBS 03/30/2007   SLEEP APNEA 03/30/2007   ABNORMAL ELECTROCARDIOGRAM 03/30/2007   History of positive PPD, untreated 03/30/2007    PCP:   Dr. Gwenlyn Fudge   REFERRING PROVIDER: Dr. Barry Dienes   REFERRING DIAG: right breast cancer   THERAPY DIAG:  localized swelling of right upper limb l  ONSET DATE:  sugery 727/2015 with some swelling after surgery, more swelling noted about 3 months   Rationale for Evaluation and Treatment Rehabilitation  SUBJECTIVE  SUBJECTIVE STATEMENT:  Pt states that she has been having intermittent swelling in her right arm especially in her hand for the past 3 months.  She states that she occasionally has coldness in her hand. She puts her juzo  20-30 sleeve and gauntlet on for a little while and it gets better. She tries to do the manual lymph drainage that she remembers from several years ago. She has changed her sleep position and has a new chair and wonders if that might be contributing to it  She lives in [place]:   PAIN:  Are you having pain? No PRECAUTIONS: at risk for lymphedema   WEIGHT BEARING RESTRICTIONS No  FALLS:  Has patient fallen in last 6 months? No  LIVING ENVIRONMENT: Lives with: lives with their family and lives with their spouse Lives in: House/apartment   OCCUPATION: retired   LEISURE: walks 30-60 minutes daily on her property occasionally does yoga and floor exercises   HAND DOMINANCE : right   PRIOR LEVEL OF FUNCTION: Independent  PATIENT GOALS   to get the swelling from her arm    OBJECTIVE  COGNITION:  Overall cognitive status: Within functional limits for tasks assessed   PALPATION: No firmness felt   OBSERVATIONS / OTHER ASSESSMENTS: Quick DASH: 29.56  POSTURE: forward head, rounded  shoulders, decreased kyphosis   UPPER EXTREMITY AROM/PROM:  A/PROM RIGHT   eval   Shoulder extension   Shoulder flexion 158  Shoulder abduction 175  Shoulder internal rotation   Shoulder external rotation     (Blank rows = not tested)  A/PROM LEFT   eval  Shoulder extension   Shoulder flexion 160  Shoulder abduction 175  Shoulder internal rotation   Shoulder external rotation     (Blank rows = not tested)    UPPER EXTREMITY STRENGTH: WNL to isometric testing but pt does have decreased circumference in right upper arm and states she right arm feels weak at times     LYMPHEDEMA ASSESSMENTS:   SURGERY TYPE/DATE: 08/30/2013  NUMBER OF LYMPH NODES REMOVED: 2  CHEMOTHERAPY: no  RADIATION:yes  HORMONE TREATMENT: no  INFECTIONS: no  LYMPHEDEMA ASSESSMENTS:   LANDMARK RIGHT  eval  10 cm proximal to olecranon process 28.1  Olecranon process 26   10 cm proximal to ulnar styloid process 22.1  25.5  Just proximal to ulnar styloid process 17  Across hand at thumb web space 20  At base of 2nd digit 7  (Blank rows = not tested)  LANDMARK LEFT  eval  10 cm proximal to olecranon process 29  Olecranon process 26  10 cm proximal to ulnar styloid process 22  Just proximal to ulnar styloid process 17  Across hand at thumb web space 19.8  At base of 2nd digit 7  (Blank rows = not tested)       QUICK DASH SURVEY: 29.56   TODAY'S TREATMENT  Gave pt information about websites for lymphatic flow yoga and self MLD to right UE  PATIENT EDUCATION:  Education details: as above Person educated: Patient Education method: Theatre stage manager Education comprehension: verbalized understanding   ASSESSMENT:  CLINICAL IMPRESSION: Patient is a 71 y.o. female  who was seen today for physical therapy evaluation and treatment for possibly lymphedema of right arm . Her ROM measurement are within functional limits and she has no differences in circumference measurements  except for slight decrease in right upper arm.  She reports she may have occasional weakness here too. She  does not have any neck or shoulder pain.  Pt thinks that her problem may be coming from a different way she is sleeping and sitting in a new chair where her neck is hyperextended.  She will modify that and do lymphatic flow yoga series which includes neck and thoracici ROM.  She will follow up with her PCP if problems continue. She does not have symptoms of lymphedema a this time and does not need follow up PT for that. Pt verbalized understanding of plan    OBJECTIVE IMPAIRMENTS pt with slight decrease in right upper arm circumference due to ??  She will monitor feelings of weakness and coldness and go to her family doctor if they persist     ACTIVITY LIMITATIONS  none  PARTICIPATION LIMITATIONS:  none but pt lives a long way from clinic   [place] 1-3 factors: previous surgery and radiation to right upper quadrant are also affecting patient's functional outcome.   REHAB POTENTIAL: Excellent  CLINICAL DECISION MAKING: Stable/uncomplicated  EVALUATION COMPLEXITY: Low  GOALS: Goals reviewed with patient? Yes  SHORT TERM GOALS: Target date:  06/26/2021     Pt will report she has an understanding of how to get access to lymphatic yoga series and self MLD sequence onling  Goal status: MET  PT FREQUENCY: one time visit   PLANNED INTERVENTIONS: self care , patient instructions  PLAN FOR NEXT SESSION: no follow up needed   Donato Heinz. Owens Shark PT  Norwood Levo, PT 06/26/2021, 12:58 PM
